# Patient Record
Sex: Female | Born: 1955
Health system: Southern US, Community
[De-identification: ages and names within clinical notes are randomized; demographics above are authoritative.]

## PROBLEM LIST (undated history)

## (undated) ENCOUNTER — Ambulatory Visit: Admission: EM | Payer: BC Managed Care – PPO | Source: Home / Self Care

## (undated) DIAGNOSIS — R5381 Other malaise: Secondary | ICD-10-CM

## (undated) DIAGNOSIS — E2839 Other primary ovarian failure: Secondary | ICD-10-CM

## (undated) DIAGNOSIS — E559 Vitamin D deficiency, unspecified: Secondary | ICD-10-CM

## (undated) DIAGNOSIS — R5383 Other fatigue: Secondary | ICD-10-CM

## (undated) DIAGNOSIS — J45909 Unspecified asthma, uncomplicated: Secondary | ICD-10-CM

## (undated) DIAGNOSIS — M797 Fibromyalgia: Secondary | ICD-10-CM

## (undated) HISTORY — DX: Other fatigue: R53.83

## (undated) HISTORY — DX: Other malaise: R53.81

## (undated) HISTORY — PX: BREAST CYST ASPIRATION: SHX578

## (undated) HISTORY — DX: Other primary ovarian failure: E28.39

## (undated) HISTORY — DX: Vitamin D deficiency, unspecified: E55.9

## (undated) HISTORY — PX: CYSTECTOMY: SHX5119

---
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
## 2008-02-20 ENCOUNTER — Emergency Department (HOSPITAL_COMMUNITY): Admission: EM | Admit: 2008-02-20 | Discharge: 2008-02-20 | Payer: Self-pay | Admitting: Emergency Medicine

## 2008-02-29 ENCOUNTER — Emergency Department (HOSPITAL_COMMUNITY): Admission: EM | Admit: 2008-02-29 | Discharge: 2008-02-29 | Payer: Self-pay | Admitting: Emergency Medicine

## 2010-01-04 ENCOUNTER — Emergency Department (HOSPITAL_COMMUNITY): Admission: EM | Admit: 2010-01-04 | Discharge: 2010-01-04 | Payer: Self-pay | Admitting: Emergency Medicine

## 2014-12-09 ENCOUNTER — Other Ambulatory Visit: Payer: Self-pay | Admitting: Obstetrics and Gynecology

## 2014-12-09 DIAGNOSIS — N6310 Unspecified lump in the right breast, unspecified quadrant: Secondary | ICD-10-CM

## 2014-12-16 ENCOUNTER — Ambulatory Visit
Admission: RE | Admit: 2014-12-16 | Discharge: 2014-12-16 | Disposition: A | Discharge status: Home / Self Care | Payer: 59 | Source: Ambulatory Visit | Attending: Obstetrics and Gynecology | Admitting: Obstetrics and Gynecology

## 2014-12-16 DIAGNOSIS — N6310 Unspecified lump in the right breast, unspecified quadrant: Secondary | ICD-10-CM

## 2015-08-09 ENCOUNTER — Other Ambulatory Visit (HOSPITAL_COMMUNITY): Payer: Self-pay | Admitting: Internal Medicine

## 2015-08-09 DIAGNOSIS — R519 Headache, unspecified: Secondary | ICD-10-CM

## 2015-08-09 DIAGNOSIS — R51 Headache: Principal | ICD-10-CM

## 2015-08-09 DIAGNOSIS — G8929 Other chronic pain: Secondary | ICD-10-CM

## 2015-08-29 ENCOUNTER — Ambulatory Visit (HOSPITAL_COMMUNITY): Payer: 59

## 2015-12-22 ENCOUNTER — Other Ambulatory Visit: Payer: Self-pay

## 2015-12-22 DIAGNOSIS — Z1231 Encounter for screening mammogram for malignant neoplasm of breast: Secondary | ICD-10-CM

## 2016-01-09 ENCOUNTER — Ambulatory Visit
Admission: RE | Admit: 2016-01-09 | Discharge: 2016-01-09 | Disposition: A | Discharge status: Home / Self Care | Payer: BLUE CROSS/BLUE SHIELD | Source: Ambulatory Visit

## 2016-01-09 DIAGNOSIS — Z1231 Encounter for screening mammogram for malignant neoplasm of breast: Secondary | ICD-10-CM

## 2016-01-15 ENCOUNTER — Emergency Department (HOSPITAL_COMMUNITY)
Admission: EM | Admit: 2016-01-15 | Discharge: 2016-01-15 | Disposition: A | Discharge status: Home / Self Care | Payer: BLUE CROSS/BLUE SHIELD | Attending: Emergency Medicine | Admitting: Emergency Medicine

## 2016-01-15 ENCOUNTER — Emergency Department (HOSPITAL_COMMUNITY): Payer: BLUE CROSS/BLUE SHIELD

## 2016-01-15 ENCOUNTER — Encounter (HOSPITAL_COMMUNITY): Payer: Self-pay | Admitting: Emergency Medicine

## 2016-01-15 DIAGNOSIS — R51 Headache: Secondary | ICD-10-CM | POA: Diagnosis not present

## 2016-01-15 DIAGNOSIS — R509 Fever, unspecified: Secondary | ICD-10-CM | POA: Diagnosis not present

## 2016-01-15 DIAGNOSIS — R519 Headache, unspecified: Secondary | ICD-10-CM

## 2016-01-15 DIAGNOSIS — R000 Tachycardia, unspecified: Secondary | ICD-10-CM | POA: Diagnosis not present

## 2016-01-15 DIAGNOSIS — R05 Cough: Secondary | ICD-10-CM | POA: Diagnosis not present

## 2016-01-15 DIAGNOSIS — J40 Bronchitis, not specified as acute or chronic: Secondary | ICD-10-CM | POA: Insufficient documentation

## 2016-01-15 HISTORY — DX: Unspecified asthma, uncomplicated: J45.909

## 2016-01-15 MED ORDER — BENZONATATE 100 MG PO CAPS
100.0000 mg | ORAL_CAPSULE | Freq: Once | ORAL | Status: AC
  Administered 2016-01-15: 100 mg via ORAL
  Filled 2016-01-15: qty 1

## 2016-01-15 MED ORDER — ALBUTEROL SULFATE (2.5 MG/3ML) 0.083% IN NEBU
5.0000 mg | INHALATION_SOLUTION | Freq: Once | RESPIRATORY_TRACT | Status: AC
  Administered 2016-01-15: 5 mg via RESPIRATORY_TRACT
  Filled 2016-01-15: qty 6

## 2016-01-15 MED ORDER — KETOROLAC TROMETHAMINE 60 MG/2ML IM SOLN
60.0000 mg | Freq: Once | INTRAMUSCULAR | Status: AC
  Administered 2016-01-15: 60 mg via INTRAMUSCULAR
  Filled 2016-01-15: qty 2

## 2016-01-15 MED ORDER — BENZONATATE 100 MG PO CAPS: 100.0000 mg | ORAL_CAPSULE | Freq: Three times a day (TID) | ORAL | Status: DC

## 2016-01-15 MED ORDER — SUMATRIPTAN SUCCINATE 25 MG PO TABS: 50.0000 mg | ORAL_TABLET | ORAL | Status: DC | PRN

## 2016-01-15 MED ORDER — METHYLPREDNISOLONE 4 MG PO TBPK: ORAL_TABLET | ORAL | Status: DC

## 2016-01-15 MED ORDER — IBUPROFEN 800 MG PO TABS: 800.0000 mg | ORAL_TABLET | Freq: Three times a day (TID) | ORAL | Status: DC

## 2016-01-15 NOTE — ED Provider Notes (Signed)
CSN: [id number]     Arrival date & time 01/15/16  1707 History   First MD Initiated Contact with Patient 01/15/16 1723     Chief Complaint  Patient presents with  . Cough     (Consider location/radiation/quality/duration/timing/severity/associated sxs/prior Treatment) HPI Comments: The pt has had cough for 7 days Sore throat and wheezing Has HA as well - sx are persistent - nothing makes better - tried allergy meds without relief Today with fever of 100.9 at home.  No n/v/d.  No sick contacts.  Patient is a 60 y.o. female presenting with cough. The history is provided by the patient.  Cough   Past Medical History  Diagnosis Date  . Asthma    Past Surgical History  Procedure Laterality Date  . Cystectomy     History reviewed. No pertinent family history. Social History  Substance Use Topics  . Smoking status: Never Smoker   . Smokeless tobacco: None  . Alcohol Use: Yes     Comment: occ   OB History    No data available     Review of Systems  Respiratory: Positive for cough.   All other systems reviewed and are negative.     Allergies  Codeine  Home Medications   Prior to Admission medications   Medication Sig Start Date End Date Taking? Authorizing Provider  benzonatate (TESSALON) 100 MG capsule Take 1 capsule (100 mg total) by mouth every 8 (eight) hours. 01/15/16   Eber HongBrian Hartleigh Edmonston, MD  ibuprofen (ADVIL,MOTRIN) 800 MG tablet Take 1 tablet (800 mg total) by mouth 3 (three) times daily. 01/15/16   Eber HongBrian Makari Portman, MD  methylPREDNISolone (MEDROL DOSEPAK) 4 MG TBPK tablet As 6 day taper 01/15/16   Eber HongBrian Staphanie Harbison, MD  SUMAtriptan (IMITREX) 25 MG tablet Take 2 tablets (50 mg total) by mouth every 2 (two) hours as needed for migraine (ongoing headache). Maximum daily dose 200mg  01/15/16   Eber HongBrian Alquan Morrish, MD   BP 118/72 mmHg  Pulse 108  Temp(Src) 99.6 F (37.6 C) (Oral)  Resp 16  Ht 5' (1.524 m)  Wt 130 lb (58.968 kg)  BMI 25.39 kg/m2  SpO2 99% Physical Exam   Constitutional: She appears well-developed and well-nourished. No distress.  HENT:  Head: Normocephalic and atraumatic.  Mouth/Throat: Oropharynx is clear and moist. No oropharyngeal exudate.  Eyes: Conjunctivae and EOM are normal. Pupils are equal, round, and reactive to light. Right eye exhibits no discharge. Left eye exhibits no discharge. No scleral icterus.  Neck: Normal range of motion. Neck supple. No JVD present. No thyromegaly present.  Cardiovascular: Regular rhythm, normal heart sounds and intact distal pulses.  Exam reveals no gallop and no friction rub.   No murmur heard. Mild tachycardia, strong pulses in both arms  Pulmonary/Chest: Effort normal. No respiratory distress. She has wheezes ( scant expiratory intermittent wheezing). She has no rales.  Abdominal: Soft. Bowel sounds are normal. She exhibits no distension and no mass. There is no tenderness.  Musculoskeletal: Normal range of motion. She exhibits no edema or tenderness.  Lymphadenopathy:    She has no cervical adenopathy.  Neurological: She is alert. Coordination normal.  Skin: Skin is warm and dry. No rash noted. No erythema.  Psychiatric: She has a normal mood and affect. Her behavior is normal.  Nursing note and vitals reviewed.   ED Course  Procedures (including critical care time) Labs Review Labs Reviewed - No data to display  Imaging Review Dg Chest 2 View  01/15/2016  CLINICAL DATA:  Cough  and fever 1 week. EXAM: CHEST  2 VIEW COMPARISON:  None. FINDINGS: Lungs are adequately inflated without consolidation or effusion. Cardiomediastinal silhouette is within normal. There mild degenerate changes of the spine. IMPRESSION: No active cardiopulmonary disease. Electronically Signed   By: Elberta Fortis M.D.   On: 01/15/2016 18:19   I have personally reviewed and evaluated these images and lab results as part of my medical decision-making.   MDM   Final diagnoses:  Bronchitis  Nonintractable headache,  unspecified chronicity pattern, unspecified headache type    Pt is well appearing - VS are normal other than mild tachy - has lungs with some wheezing, no rales, no distress, speak is full sentences, no distress  Tesaslon Toradol Albuterol CXR r/o pna Pt in agreement.  Cough better - ha with mild improvement CXR without acuet findings Pt improved - informed of results.  Meds given in ED:  Medications  benzonatate (TESSALON) capsule 100 mg (100 mg Oral Given 01/15/16 1806)  ketorolac (TORADOL) injection 60 mg (60 mg Intramuscular Given 01/15/16 1805)  albuterol (PROVENTIL) (2.5 MG/3ML) 0.083% nebulizer solution 5 mg (5 mg Nebulization Given 01/15/16 1829)    New Prescriptions   BENZONATATE (TESSALON) 100 MG CAPSULE    Take 1 capsule (100 mg total) by mouth every 8 (eight) hours.   IBUPROFEN (ADVIL,MOTRIN) 800 MG TABLET    Take 1 tablet (800 mg total) by mouth 3 (three) times daily.   METHYLPREDNISOLONE (MEDROL DOSEPAK) 4 MG TBPK TABLET    As 6 day taper   SUMATRIPTAN (IMITREX) 25 MG TABLET    Take 2 tablets (50 mg total) by mouth every 2 (two) hours as needed for migraine (ongoing headache). Maximum daily dose        Eber Hong, MD 01/15/16 Ernestina [place]

## 2016-01-15 NOTE — ED Notes (Signed)
Pt reports cough, seasonal allergies, and fever x1 week.  Pt took advil today at 1400. Pt also has h/a x1 week.  Pt alert and oriented.

## 2016-01-15 NOTE — ED Notes (Signed)
Pt alert & oriented x4, stable gait. Patient given discharge instructions, paperwork & prescription(s). Patient instructed to stop at the registration desk to finish any additional paperwork. Patient verbalized understanding. Pt left department in wheelchair escorted by staff. Pt left w/ no further questions. 

## 2016-01-15 NOTE — Discharge Instructions (Signed)
Albuterol 2 puffs every 4 hours for 24 hours, then as needed every 4 hours Medrol dose pack for 6 days Tessalon every 8 hours as needed Motrin 3 times daily for headache / fever Imitrex for headache  Please obtain all of your results from medical records or have your doctors office obtain the results - share them with your doctor - you should be seen at your doctors office in the next 2 days. Call today to arrange your follow up. Take the medications as prescribed. Please review all of the medicines and only take them if you do not have an allergy to them. Please be aware that if you are taking birth control pills, taking other prescriptions, ESPECIALLY ANTIBIOTICS may make the birth control ineffective - if this is the case, either do not engage in sexual activity or use alternative methods of birth control such as condoms until you have finished the medicine and your family doctor says it is OK to restart them. If you are on a blood thinner such as COUMADIN, be aware that any other medicine that you take may cause the coumadin to either work too much, or not enough - you should have your coumadin level rechecked in next 7 days if this is the case.  ?  It is also a possibility that you have an allergic reaction to any of the medicines that you have been prescribed - Everybody reacts differently to medications and while MOST people have no trouble with most medicines, you may have a reaction such as nausea, vomiting, rash, swelling, shortness of breath. If this is the case, please stop taking the medicine immediately and contact your physician.  ?  You should return to the ER if you develop severe or worsening symptoms.

## 2016-01-18 ENCOUNTER — Other Ambulatory Visit (HOSPITAL_COMMUNITY): Payer: Self-pay | Admitting: Internal Medicine

## 2016-01-18 DIAGNOSIS — G4482 Headache associated with sexual activity: Secondary | ICD-10-CM | POA: Diagnosis not present

## 2016-01-18 DIAGNOSIS — R519 Headache, unspecified: Secondary | ICD-10-CM

## 2016-01-18 DIAGNOSIS — R51 Headache: Principal | ICD-10-CM

## 2016-01-18 DIAGNOSIS — R739 Hyperglycemia, unspecified: Secondary | ICD-10-CM | POA: Diagnosis not present

## 2016-02-01 ENCOUNTER — Other Ambulatory Visit (HOSPITAL_COMMUNITY): Payer: BLUE CROSS/BLUE SHIELD

## 2016-02-15 ENCOUNTER — Ambulatory Visit (HOSPITAL_COMMUNITY)
Admission: RE | Admit: 2016-02-15 | Discharge: 2016-02-15 | Disposition: A | Discharge status: Home / Self Care | Payer: BLUE CROSS/BLUE SHIELD | Source: Ambulatory Visit | Attending: Internal Medicine | Admitting: Internal Medicine

## 2016-02-15 DIAGNOSIS — M720 Palmar fascial fibromatosis [Dupuytren]: Secondary | ICD-10-CM | POA: Insufficient documentation

## 2016-02-15 DIAGNOSIS — R519 Headache, unspecified: Secondary | ICD-10-CM

## 2016-02-15 DIAGNOSIS — R51 Headache: Secondary | ICD-10-CM | POA: Diagnosis not present

## 2016-05-16 DIAGNOSIS — M79671 Pain in right foot: Secondary | ICD-10-CM | POA: Diagnosis not present

## 2016-05-28 DIAGNOSIS — G5761 Lesion of plantar nerve, right lower limb: Secondary | ICD-10-CM | POA: Diagnosis not present

## 2016-05-28 DIAGNOSIS — M7661 Achilles tendinitis, right leg: Secondary | ICD-10-CM | POA: Diagnosis not present

## 2016-06-22 DIAGNOSIS — M79671 Pain in right foot: Secondary | ICD-10-CM | POA: Diagnosis not present

## 2016-07-26 DIAGNOSIS — G44009 Cluster headache syndrome, unspecified, not intractable: Secondary | ICD-10-CM | POA: Diagnosis not present

## 2016-08-03 DIAGNOSIS — M79671 Pain in right foot: Secondary | ICD-10-CM | POA: Diagnosis not present

## 2016-09-11 DIAGNOSIS — J019 Acute sinusitis, unspecified: Secondary | ICD-10-CM | POA: Diagnosis not present

## 2016-10-04 DIAGNOSIS — H903 Sensorineural hearing loss, bilateral: Secondary | ICD-10-CM | POA: Diagnosis not present

## 2016-10-11 DIAGNOSIS — Z0389 Encounter for observation for other suspected diseases and conditions ruled out: Secondary | ICD-10-CM | POA: Diagnosis not present

## 2016-10-11 DIAGNOSIS — R5383 Other fatigue: Secondary | ICD-10-CM | POA: Diagnosis not present

## 2016-10-11 DIAGNOSIS — Z0000 Encounter for general adult medical examination without abnormal findings: Secondary | ICD-10-CM | POA: Diagnosis not present

## 2016-10-11 DIAGNOSIS — Z23 Encounter for immunization: Secondary | ICD-10-CM | POA: Diagnosis not present

## 2016-10-11 DIAGNOSIS — E559 Vitamin D deficiency, unspecified: Secondary | ICD-10-CM | POA: Diagnosis not present

## 2016-10-11 DIAGNOSIS — R739 Hyperglycemia, unspecified: Secondary | ICD-10-CM | POA: Diagnosis not present

## 2016-10-11 DIAGNOSIS — E785 Hyperlipidemia, unspecified: Secondary | ICD-10-CM | POA: Diagnosis not present

## 2016-12-05 DIAGNOSIS — M545 Low back pain: Secondary | ICD-10-CM | POA: Diagnosis not present

## 2017-01-07 DIAGNOSIS — M25511 Pain in right shoulder: Secondary | ICD-10-CM | POA: Diagnosis not present

## 2017-01-15 DIAGNOSIS — R5383 Other fatigue: Secondary | ICD-10-CM | POA: Diagnosis not present

## 2017-01-15 DIAGNOSIS — E559 Vitamin D deficiency, unspecified: Secondary | ICD-10-CM | POA: Diagnosis not present

## 2017-01-25 DIAGNOSIS — Z1231 Encounter for screening mammogram for malignant neoplasm of breast: Secondary | ICD-10-CM | POA: Diagnosis not present

## 2017-02-01 DIAGNOSIS — M25511 Pain in right shoulder: Secondary | ICD-10-CM | POA: Diagnosis not present

## 2017-04-05 DIAGNOSIS — M25511 Pain in right shoulder: Secondary | ICD-10-CM | POA: Diagnosis not present

## 2017-05-09 DIAGNOSIS — T63481A Toxic effect of venom of other arthropod, accidental (unintentional), initial encounter: Secondary | ICD-10-CM | POA: Diagnosis not present

## 2017-05-21 ENCOUNTER — Other Ambulatory Visit (HOSPITAL_COMMUNITY): Payer: Self-pay | Admitting: Internal Medicine

## 2017-05-21 ENCOUNTER — Ambulatory Visit (HOSPITAL_COMMUNITY)
Admission: RE | Admit: 2017-05-21 | Discharge: 2017-05-21 | Disposition: A | Discharge status: Home / Self Care | Payer: BLUE CROSS/BLUE SHIELD | Source: Ambulatory Visit | Attending: Internal Medicine | Admitting: Internal Medicine

## 2017-05-21 DIAGNOSIS — R059 Cough, unspecified: Secondary | ICD-10-CM

## 2017-05-21 DIAGNOSIS — R918 Other nonspecific abnormal finding of lung field: Secondary | ICD-10-CM | POA: Diagnosis not present

## 2017-05-21 DIAGNOSIS — R05 Cough: Secondary | ICD-10-CM

## 2017-06-03 DIAGNOSIS — R05 Cough: Secondary | ICD-10-CM | POA: Diagnosis not present

## 2017-06-25 DIAGNOSIS — J019 Acute sinusitis, unspecified: Secondary | ICD-10-CM | POA: Diagnosis not present

## 2017-07-10 DIAGNOSIS — R739 Hyperglycemia, unspecified: Secondary | ICD-10-CM | POA: Diagnosis not present

## 2017-07-10 DIAGNOSIS — N390 Urinary tract infection, site not specified: Secondary | ICD-10-CM | POA: Diagnosis not present

## 2017-07-10 DIAGNOSIS — E559 Vitamin D deficiency, unspecified: Secondary | ICD-10-CM | POA: Diagnosis not present

## 2017-07-10 DIAGNOSIS — E2839 Other primary ovarian failure: Secondary | ICD-10-CM | POA: Diagnosis not present

## 2017-07-10 DIAGNOSIS — E785 Hyperlipidemia, unspecified: Secondary | ICD-10-CM | POA: Diagnosis not present

## 2017-07-10 DIAGNOSIS — R5383 Other fatigue: Secondary | ICD-10-CM | POA: Diagnosis not present

## 2017-08-28 DIAGNOSIS — E2839 Other primary ovarian failure: Secondary | ICD-10-CM | POA: Diagnosis not present

## 2017-08-28 DIAGNOSIS — R5383 Other fatigue: Secondary | ICD-10-CM | POA: Diagnosis not present

## 2017-09-02 DIAGNOSIS — Z6825 Body mass index (BMI) 25.0-25.9, adult: Secondary | ICD-10-CM | POA: Diagnosis not present

## 2017-09-02 DIAGNOSIS — Z01419 Encounter for gynecological examination (general) (routine) without abnormal findings: Secondary | ICD-10-CM | POA: Diagnosis not present

## 2017-09-02 DIAGNOSIS — Z13820 Encounter for screening for osteoporosis: Secondary | ICD-10-CM | POA: Diagnosis not present

## 2017-09-09 DIAGNOSIS — M25551 Pain in right hip: Secondary | ICD-10-CM | POA: Diagnosis not present

## 2017-09-16 DIAGNOSIS — R102 Pelvic and perineal pain: Secondary | ICD-10-CM | POA: Diagnosis not present

## 2017-09-18 DIAGNOSIS — M545 Low back pain: Secondary | ICD-10-CM | POA: Diagnosis not present

## 2017-09-19 DIAGNOSIS — R87618 Other abnormal cytological findings on specimens from cervix uteri: Secondary | ICD-10-CM | POA: Diagnosis not present

## 2017-09-19 DIAGNOSIS — R87810 Cervical high risk human papillomavirus (HPV) DNA test positive: Secondary | ICD-10-CM | POA: Diagnosis not present

## 2017-09-19 DIAGNOSIS — A630 Anogenital (venereal) warts: Secondary | ICD-10-CM | POA: Diagnosis not present

## 2017-10-01 DIAGNOSIS — Z780 Asymptomatic menopausal state: Secondary | ICD-10-CM | POA: Diagnosis not present

## 2017-10-01 DIAGNOSIS — E2839 Other primary ovarian failure: Secondary | ICD-10-CM | POA: Diagnosis not present

## 2017-10-15 HISTORY — PX: BREAST BIOPSY: SHX20

## 2017-11-05 DIAGNOSIS — R5383 Other fatigue: Secondary | ICD-10-CM | POA: Diagnosis not present

## 2017-11-05 DIAGNOSIS — E2839 Other primary ovarian failure: Secondary | ICD-10-CM | POA: Diagnosis not present

## 2017-11-05 DIAGNOSIS — E785 Hyperlipidemia, unspecified: Secondary | ICD-10-CM | POA: Diagnosis not present

## 2017-11-05 DIAGNOSIS — Z780 Asymptomatic menopausal state: Secondary | ICD-10-CM | POA: Diagnosis not present

## 2017-12-10 DIAGNOSIS — E2839 Other primary ovarian failure: Secondary | ICD-10-CM | POA: Diagnosis not present

## 2017-12-10 DIAGNOSIS — Z780 Asymptomatic menopausal state: Secondary | ICD-10-CM | POA: Diagnosis not present

## 2017-12-10 DIAGNOSIS — R5383 Other fatigue: Secondary | ICD-10-CM | POA: Diagnosis not present

## 2017-12-13 DIAGNOSIS — R69 Illness, unspecified: Secondary | ICD-10-CM | POA: Diagnosis not present

## 2017-12-13 DIAGNOSIS — R509 Fever, unspecified: Secondary | ICD-10-CM | POA: Diagnosis not present

## 2017-12-13 DIAGNOSIS — G44201 Tension-type headache, unspecified, intractable: Secondary | ICD-10-CM | POA: Diagnosis not present

## 2017-12-13 DIAGNOSIS — R110 Nausea: Secondary | ICD-10-CM | POA: Diagnosis not present

## 2018-01-21 DIAGNOSIS — J019 Acute sinusitis, unspecified: Secondary | ICD-10-CM | POA: Diagnosis not present

## 2018-01-21 DIAGNOSIS — R6882 Decreased libido: Secondary | ICD-10-CM | POA: Diagnosis not present

## 2018-02-18 ENCOUNTER — Other Ambulatory Visit: Payer: Self-pay | Admitting: Obstetrics and Gynecology

## 2018-02-18 DIAGNOSIS — N644 Mastodynia: Secondary | ICD-10-CM

## 2018-02-18 DIAGNOSIS — N6459 Other signs and symptoms in breast: Secondary | ICD-10-CM | POA: Diagnosis not present

## 2018-02-19 ENCOUNTER — Ambulatory Visit
Admission: RE | Admit: 2018-02-19 | Discharge: 2018-02-19 | Disposition: A | Discharge status: Home / Self Care | Payer: BLUE CROSS/BLUE SHIELD | Source: Ambulatory Visit | Attending: Obstetrics and Gynecology | Admitting: Obstetrics and Gynecology

## 2018-02-19 ENCOUNTER — Other Ambulatory Visit: Payer: Self-pay | Admitting: Obstetrics and Gynecology

## 2018-02-19 DIAGNOSIS — R922 Inconclusive mammogram: Secondary | ICD-10-CM | POA: Diagnosis not present

## 2018-02-19 DIAGNOSIS — N6320 Unspecified lump in the left breast, unspecified quadrant: Secondary | ICD-10-CM | POA: Diagnosis not present

## 2018-02-19 DIAGNOSIS — N644 Mastodynia: Secondary | ICD-10-CM

## 2018-02-24 ENCOUNTER — Ambulatory Visit
Admission: RE | Admit: 2018-02-24 | Discharge: 2018-02-24 | Disposition: A | Discharge status: Home / Self Care | Payer: BLUE CROSS/BLUE SHIELD | Source: Ambulatory Visit | Attending: Obstetrics and Gynecology | Admitting: Obstetrics and Gynecology

## 2018-02-24 ENCOUNTER — Other Ambulatory Visit: Payer: Self-pay | Admitting: Obstetrics and Gynecology

## 2018-02-24 DIAGNOSIS — N6320 Unspecified lump in the left breast, unspecified quadrant: Secondary | ICD-10-CM

## 2018-02-24 DIAGNOSIS — N6032 Fibrosclerosis of left breast: Secondary | ICD-10-CM | POA: Diagnosis not present

## 2018-02-24 DIAGNOSIS — N6012 Diffuse cystic mastopathy of left breast: Secondary | ICD-10-CM | POA: Diagnosis not present

## 2018-02-24 DIAGNOSIS — R928 Other abnormal and inconclusive findings on diagnostic imaging of breast: Secondary | ICD-10-CM | POA: Diagnosis not present

## 2018-02-28 ENCOUNTER — Other Ambulatory Visit: Payer: Self-pay | Admitting: Obstetrics and Gynecology

## 2018-02-28 DIAGNOSIS — N6099 Unspecified benign mammary dysplasia of unspecified breast: Secondary | ICD-10-CM

## 2018-03-03 DIAGNOSIS — R05 Cough: Secondary | ICD-10-CM | POA: Diagnosis not present

## 2018-03-03 DIAGNOSIS — R5383 Other fatigue: Secondary | ICD-10-CM | POA: Diagnosis not present

## 2018-03-03 DIAGNOSIS — R6882 Decreased libido: Secondary | ICD-10-CM | POA: Diagnosis not present

## 2018-03-03 DIAGNOSIS — Z780 Asymptomatic menopausal state: Secondary | ICD-10-CM | POA: Diagnosis not present

## 2018-03-03 DIAGNOSIS — E2839 Other primary ovarian failure: Secondary | ICD-10-CM | POA: Diagnosis not present

## 2018-03-06 ENCOUNTER — Ambulatory Visit
Admission: RE | Admit: 2018-03-06 | Discharge: 2018-03-06 | Disposition: A | Discharge status: Home / Self Care | Payer: BLUE CROSS/BLUE SHIELD | Source: Ambulatory Visit | Attending: Obstetrics and Gynecology | Admitting: Obstetrics and Gynecology

## 2018-03-06 DIAGNOSIS — N6099 Unspecified benign mammary dysplasia of unspecified breast: Secondary | ICD-10-CM

## 2018-03-06 DIAGNOSIS — N644 Mastodynia: Secondary | ICD-10-CM | POA: Diagnosis not present

## 2018-03-06 MED ORDER — GADOBENATE DIMEGLUMINE 529 MG/ML IV SOLN
12.0000 mL | Freq: Once | INTRAVENOUS | Status: AC | PRN
  Administered 2018-03-06: 12 mL via INTRAVENOUS

## 2018-06-04 DIAGNOSIS — R6882 Decreased libido: Secondary | ICD-10-CM | POA: Diagnosis not present

## 2018-06-04 DIAGNOSIS — J019 Acute sinusitis, unspecified: Secondary | ICD-10-CM | POA: Diagnosis not present

## 2018-06-04 DIAGNOSIS — R5383 Other fatigue: Secondary | ICD-10-CM | POA: Diagnosis not present

## 2018-06-04 DIAGNOSIS — G4482 Headache associated with sexual activity: Secondary | ICD-10-CM | POA: Diagnosis not present

## 2018-07-09 DIAGNOSIS — J019 Acute sinusitis, unspecified: Secondary | ICD-10-CM | POA: Diagnosis not present

## 2018-07-09 DIAGNOSIS — J209 Acute bronchitis, unspecified: Secondary | ICD-10-CM | POA: Diagnosis not present

## 2018-07-20 DIAGNOSIS — J40 Bronchitis, not specified as acute or chronic: Secondary | ICD-10-CM | POA: Diagnosis not present

## 2018-07-20 DIAGNOSIS — Z6824 Body mass index (BMI) 24.0-24.9, adult: Secondary | ICD-10-CM | POA: Diagnosis not present

## 2018-07-29 DIAGNOSIS — H1045 Other chronic allergic conjunctivitis: Secondary | ICD-10-CM | POA: Diagnosis not present

## 2018-08-06 DIAGNOSIS — S300XXA Contusion of lower back and pelvis, initial encounter: Secondary | ICD-10-CM | POA: Diagnosis not present

## 2018-08-21 DIAGNOSIS — R5383 Other fatigue: Secondary | ICD-10-CM | POA: Diagnosis not present

## 2018-08-21 DIAGNOSIS — R6882 Decreased libido: Secondary | ICD-10-CM | POA: Diagnosis not present

## 2018-08-21 DIAGNOSIS — Z23 Encounter for immunization: Secondary | ICD-10-CM | POA: Diagnosis not present

## 2018-08-21 DIAGNOSIS — E2839 Other primary ovarian failure: Secondary | ICD-10-CM | POA: Diagnosis not present

## 2018-08-21 DIAGNOSIS — M797 Fibromyalgia: Secondary | ICD-10-CM | POA: Diagnosis not present

## 2018-08-21 DIAGNOSIS — E559 Vitamin D deficiency, unspecified: Secondary | ICD-10-CM | POA: Diagnosis not present

## 2018-11-24 ENCOUNTER — Ambulatory Visit (HOSPITAL_COMMUNITY)
Admission: RE | Admit: 2018-11-24 | Discharge: 2018-11-24 | Disposition: A | Discharge status: Home / Self Care | Payer: BLUE CROSS/BLUE SHIELD | Source: Ambulatory Visit | Attending: Internal Medicine | Admitting: Internal Medicine

## 2018-11-24 ENCOUNTER — Other Ambulatory Visit (HOSPITAL_COMMUNITY): Payer: Self-pay | Admitting: Internal Medicine

## 2018-11-24 DIAGNOSIS — R0602 Shortness of breath: Secondary | ICD-10-CM | POA: Diagnosis not present

## 2018-11-24 DIAGNOSIS — R059 Cough, unspecified: Secondary | ICD-10-CM

## 2018-11-24 DIAGNOSIS — R5383 Other fatigue: Secondary | ICD-10-CM | POA: Diagnosis not present

## 2018-11-24 DIAGNOSIS — E2839 Other primary ovarian failure: Secondary | ICD-10-CM | POA: Diagnosis not present

## 2018-11-24 DIAGNOSIS — Z780 Asymptomatic menopausal state: Secondary | ICD-10-CM | POA: Diagnosis not present

## 2018-11-24 DIAGNOSIS — J209 Acute bronchitis, unspecified: Secondary | ICD-10-CM | POA: Diagnosis not present

## 2018-11-24 DIAGNOSIS — R05 Cough: Secondary | ICD-10-CM | POA: Diagnosis not present

## 2018-12-19 ENCOUNTER — Encounter: Payer: Self-pay | Admitting: Allergy & Immunology

## 2018-12-19 ENCOUNTER — Ambulatory Visit (INDEPENDENT_AMBULATORY_CARE_PROVIDER_SITE_OTHER): Payer: BLUE CROSS/BLUE SHIELD | Admitting: Allergy & Immunology

## 2018-12-19 VITALS — BP 116/64 | HR 86 | Temp 98.0°F | Resp 17 | Ht 60.63 in | Wt 125.0 lb

## 2018-12-19 DIAGNOSIS — J302 Other seasonal allergic rhinitis: Secondary | ICD-10-CM | POA: Diagnosis not present

## 2018-12-19 DIAGNOSIS — J3089 Other allergic rhinitis: Secondary | ICD-10-CM

## 2018-12-19 DIAGNOSIS — J4540 Moderate persistent asthma, uncomplicated: Secondary | ICD-10-CM | POA: Insufficient documentation

## 2018-12-19 MED ORDER — FLUTICASONE FUROATE-VILANTEROL 100-25 MCG/INH IN AEPB: 1.0000 | INHALATION_SPRAY | Freq: Every day | RESPIRATORY_TRACT | 5 refills | Status: DC

## 2018-12-19 MED ORDER — TRIAMCINOLONE ACETONIDE 55 MCG/ACT NA AERO: 1.0000 | INHALATION_SPRAY | Freq: Every day | NASAL | 5 refills | Status: DC

## 2018-12-19 MED ORDER — EPINEPHRINE 0.3 MG/0.3ML IJ SOAJ: 0.3000 mg | INTRAMUSCULAR | 2 refills | Status: DC | PRN

## 2018-12-19 MED ORDER — METHYLPREDNISOLONE ACETATE 40 MG/ML IJ SUSP
40.0000 mg | Freq: Once | INTRAMUSCULAR | Status: AC
  Administered 2018-12-19: 40 mg via INTRAMUSCULAR

## 2018-12-19 NOTE — Addendum Note (Signed)
Addended by: Shona Simpson A on: 12/19/2018 12:17 PM   Modules accepted: Orders

## 2018-12-19 NOTE — Progress Notes (Signed)
oi

## 2018-12-19 NOTE — Progress Notes (Signed)
NEW PATIENT  Date of Service/Encounter:  12/19/18  Referring provider: Doree Albee, MD   Assessment:   Moderate persistent asthma, uncomplicated   Seasonal and perennial allergic rhinitis (ragweed, weeds, grasses, indoor molds, dust mites, cat and dog)  Plan/Recommendations:   1. Moderate persistent asthma, uncomplicated - Lung testing looked good today, but I think you would benefit from a daily controller inhaler. - We will start Breo one puff once daily (this contains a long acting albuterol combined with a long acting albuterol). - Hopefully this will decrease the need for the albuterol rescue inhaler. - Daily controller medication(s): Breo 100/52mg one puff once daily - Prior to physical activity: albuterol 2 puffs 10-15 minutes before physical activity. - Rescue medications: albuterol 4 puffs every 4-6 hours as needed - Asthma control goals:  * Full participation in all desired activities (may need albuterol before activity) * Albuterol use two time or less a week on average (not counting use with activity) * Cough interfering with sleep two time or less a month * Oral steroids no more than once a year * No hospitalizations  2. Chronic rhinitis - Testing today showed: ragweed, weeds, grasses, indoor molds, dust mites, cat and dog - Copy of test results provided.  - Avoidance measures provided. - Continue with: Allegra (fexofenadine) 187mtable once daily - Start taking: Nasacort (triamcinolone) one spray per nostril daily - You can use an extra dose of the antihistamine, if needed, for breakthrough symptoms.  - Consider nasal saline rinses 1-2 times daily to remove allergens from the nasal cavities as well as help with mucous clearance (this is especially helpful to do before the nasal sprays are given) - Make an appointment in two weeks for your first allergy shot. - Allergy shot Consent signed today.   3. Return in about 3 months (around  03/21/2019).   Subjective:   Sabrina Clark a [age].o. female presenting today for evaluation of  Chief Complaint  Patient presents with  . Allergic Rhinitis     Sabrina CARDIFFas a history of the following: Patient Active Problem List   Diagnosis Date Noted  . Seasonal and perennial allergic rhinitis 12/19/2018  . Moderate persistent asthma, uncomplicated [id number]  History obtained from: chart review and patient and her husband.  I have actually met Sabrina Clark the past, as her mother is 1 of our patients.  Sabrina Clark referred by GoDoree AlbeeMD.     MaTamerias a [age].o. female presenting for an evaluation of Chronic rhinitis and bronchitis.   Asthma/Respiratory Symptom History: She has never been officially diagnosed with asthma, but she has had an inhaler for quite some time.  Since her [age] has become ill around twice per year.  Typically it is in the spring and the fall.  During each of these episodes, she is given prednisone occasionally with an intramuscular steroid injection as well.  Over the last couple years, her frequency of bronchitis have increased.  Last year for instance she had a total of 5 courses of systemic steroids for her symptoms.  During this time, she is only ever had an albuterol inhaler.  Prior to 2 years ago her inhaler would expire before she used it up, but over the last 2 years her use of albuterol has increased at least 50%.  She has never been in the hospital for her symptoms and is never been intubated.  She does endorse some  nighttime coughing, although this is typically when she has the bronchitis episodes.  She was prescribed Singulair last year, but after reading the side effects she never took it. She had similar problems when she was a youngster, but again she never had a controller medication or a diagnosis of asthma.   Allergic Rhinitis Symptom History: She does report itchy watery eyes as well as chronic rhinitis.  This is  typically worse in the spring and fall, with winter and summer being her better months.  However over the last 2 years, she is even had symptoms during the winter and summer.  She is on Allegra consistently throughout the year.  She is able to feel it when she does not use it.  She has had Flonase at one point, but is hesitant to use these nasal sprays since she has heard that they decrease her sense of smell.  Regardless, she is reporting a loss of sense of smell anyway without the use of the sprays.  She knows that cats are definitely a trigger for her, although when she adopted her cats over the last few years she has had no increased symptoms.  She has never been on a nose spray.  She has never been tested.  Otherwise, there is no history of other atopic diseases, including food allergies, drug allergies, stinging insect allergies, eczema, urticaria or contact dermatitis. There is no significant infectious history. Vaccinations are up to date. She has received a Pneumovax vaccination.   Past Medical History: Patient Active Problem List   Diagnosis Date Noted  . Seasonal and perennial allergic rhinitis 12/19/2018  . Moderate persistent asthma, uncomplicated 76/28/3151    Medication List:  Allergies as of 12/19/2018      Reactions   Codeine Hives   Itch    Cefdinir    Diphenhydramine Hcl Hives      Medication List       Accurate as of December 19, 2018 12:00 PM. Always use your most recent med list.        butalbital-acetaminophen-caffeine 50-325-40 MG tablet Commonly known as:  FIORICET, ESGIC   clonazePAM 0.5 MG tablet Commonly known as:  KLONOPIN   EPINEPHrine 0.3 mg/0.3 mL Soaj injection Commonly known as:  Auvi-Q Inject 0.3 mLs (0.3 mg total) into the muscle as needed for anaphylaxis.   estradiol 1 MG tablet Commonly known as:  ESTRACE   fluticasone furoate-vilanterol 100-25 MCG/INH Aepb Commonly known as:  Breo Ellipta Inhale 1 puff into the lungs daily.   guaiFENesin  600 MG 12 hr tablet Commonly known as:  MUCINEX Take 1,200 mg by mouth 2 (two) times daily.   ibuprofen 800 MG tablet Commonly known as:  ADVIL,MOTRIN Take 1 tablet (800 mg total) by mouth 3 (three) times daily.   NP Thyroid 60 MG tablet Generic drug:  thyroid   ProAir HFA 108 (90 Base) MCG/ACT inhaler Generic drug:  albuterol   progesterone 100 MG capsule Commonly known as:  PROMETRIUM   triamcinolone 55 MCG/ACT Aero nasal inhaler Commonly known as:  NASACORT Place 1 spray into the nose daily.   Vitamin D3 250 MCG (10000 UT) Tabs Take 1,000 Units by mouth.       Birth History: non-contributory  Developmental History: non-contributory.   Past Surgical History: Past Surgical History:  Procedure Laterality Date  . CYSTECTOMY       Family History: Family History  Problem Relation Age of Onset  . Allergic rhinitis Father      Social History:  Yulieth lives at home with her husband of 10 years.  They live in a house that was built in 1995.  There is wood in the main living areas in the bedrooms.  They have electric heating and central cooling.  They have 2 small dogs and 2 cats in the home.  They actually live on a "h[place] farm".  They have a total of 3 large dogs, 2 cats, 7 cows, 3 llamas, 2 alpacas, 1 goat, 1 meal, 3 geese, 2 turkeys, and 12 chickens.  They do not sell any of their eggs, but instead use them.  They will sell the baby cows.  They do not milk the cows.  Her husband has a much larger cattle farm operation as well.  In total, they have 25 acres where they live and over 150 acres from their cattle farm.  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   Review of Systems  Constitutional: Negative.  Negative for fever, malaise/fatigue and weight loss.  HENT: Positive for congestion and sore throat. Negative for ear discharge and ear pain.        Positive for throat  clearing.  Eyes: Negative for pain, discharge and redness.  Respiratory: Positive for cough. Negative for sputum production, shortness of breath, wheezing and stridor.   Cardiovascular: Negative.  Negative for chest pain and palpitations.  Gastrointestinal: Negative for abdominal pain and heartburn.  Skin: Negative.  Negative for itching and rash.  Neurological: Negative for dizziness and headaches.  Endo/Heme/Allergies: Positive for environmental allergies. Does not bruise/bleed easily.       Objective:   Blood pressure 116/64, pulse 86, temperature 98 F (36.7 C), temperature source Oral, resp. rate 17, height 5' 0.63" (1.54 m), weight 125 lb (56.7 kg), SpO2 96 %. Body mass index is 23.91 kg/m.   Physical Exam:   Physical Exam  Constitutional: She appears well-developed.  Very pleasant female.  Talkative.  HENT:  Head: Normocephalic and atraumatic.  Right Ear: Tympanic membrane, external ear and ear canal normal. No drainage, swelling or tenderness. Tympanic membrane is not injected, not scarred, not erythematous, not retracted and not bulging.  Left Ear: Tympanic membrane, external ear and ear canal normal. No drainage, swelling or tenderness. Tympanic membrane is not injected, not scarred, not erythematous, not retracted and not bulging.  Nose: Mucosal edema and rhinorrhea present. No nasal deformity or septal deviation. No epistaxis. Right sinus exhibits no maxillary sinus tenderness and no frontal sinus tenderness. Left sinus exhibits no maxillary sinus tenderness and no frontal sinus tenderness.  Mouth/Throat: Uvula is midline and oropharynx is clear and moist. Mucous membranes are not pale and not dry.  No cerumen present.  TMs appear pearly white.  She does have some cobblestoning present in the posterior oropharynx.  Eyes: Pupils are equal, round, and reactive to light. Conjunctivae and EOM are normal. Right eye exhibits no chemosis and no discharge. Left eye exhibits no  chemosis and no discharge. Right conjunctiva is not injected. Left conjunctiva is not injected.  Cardiovascular: Normal rate, regular rhythm and normal heart sounds.  Respiratory: Effort normal and breath sounds normal. No accessory muscle usage. No tachypnea. No respiratory distress. She has no wheezes. She has no rhonchi. She has no rales. She exhibits no tenderness.  Moving air well in all lung fields.  Intermittent dry cough present.  GI: There is no abdominal tenderness. There is no rebound  and no guarding.  Lymphadenopathy:       Head (right side): No submandibular, no tonsillar and no occipital adenopathy present.       Head (left side): No submandibular, no tonsillar and no occipital adenopathy present.    She has cervical adenopathy.  Bilateral shotty cervical lymphadenopathy.  Neurological: She is alert.  Skin: No abrasion, no petechiae and no rash noted. Rash is not papular, not vesicular and not urticarial. No erythema. No pallor.  No eczematous lesions noted.  Psychiatric: She has a normal mood and affect.     Diagnostic studies:    Spirometry: results abnormal (FEV1: 1.58/85%, FVC: 2.59/101%, FEV1/FVC: 61%).    Spirometry consistent with mild obstructive disease.  Allergy Studies:    Airborne Adult Perc - 12/19/18 1000    Time Antigen Placed  1000    Allergen Manufacturer  Greer    Location  Back    Number of Test  60    Panel 1  Select    1. Control-Buffer 50% Glycerol  Negative    2. Control-Histamine 1 mg/ml  2+    3. Albumin saline  Negative    4. Show Low  2+    5. [place]  Negative    6. Johnson  Negative    7. [place] Blue  2+    8. Meadow Fescue  3+    9. Perennial Rye  3+    10. Sweet Vernal  3+    11. Timothy  4+    12. Cocklebur  Negative    13. Burweed Marshelder  Negative    14. Ragweed, short  2+    15. Ragweed, Giant  Negative    16. Plantain,  English  Negative    17. Lamb's Quarters  Negative    18. Sheep Sorrell  Negative    19. Rough  Pigweed  Negative    20. Marsh Elder, Rough  Negative    21. Mugwort, Common  Negative    22. Ash mix  Negative    23. Birch mix  Negative    24. Beech American  Negative    25. Box, Elder  Negative    26. Cedar, red  Negative    27. Cottonwood, [place]  Negative    28. Elm mix  Negative    29. Hickory mix  Negative    30. Maple mix  Negative    31. Oak, [place] mix  Negative    32. Pecan Pollen  Negative    33. Pine mix  Negative    34. Sycamore Eastern  Negative    35. [place], Black Pollen  Negative    36. Alternaria alternata  Negative    37. Cladosporium Herbarum  Negative    38. Aspergillus mix  Negative    39. Penicillium mix  Negative    40. Bipolaris sorokiniana (Helminthosporium)  Negative    41. Drechslera spicifera (Curvularia)  Negative    42. Mucor plumbeus  Negative    43. Fusarium moniliforme  Negative    44. Aureobasidium pullulans (pullulara)  Negative    45. Rhizopus oryzae  Negative    46. Botrytis cinera  Negative    47. Epicoccum nigrum  Negative    48. Phoma betae  Negative    49. Candida Albicans  Negative    50. Trichophyton mentagrophytes  Negative    51. Mite, D Farinae  5,000 AU/ml  4+    52. Mite, D Pteronyssinus  5,000 AU/ml  4+  53. Cat Hair 10,000 BAU/ml  2+    54.  Dog Epithelia  Negative    55. Mixed Feathers  Negative    56. Horse Epithelia  Negative    57. Cockroach, German  Negative    58. Mouse  Negative    59. Tobacco Leaf  Negative    Comments  Cattle IgE: NEGATIVE     Intradermal - 12/19/18 1106    Allergen Manufacturer  Lavella Hammock    Location  Arm    Number of Test  11   15   Intradermal  Select    Control  Negative    Guatem[place]  Negative    Johnson  1+    7 Grass  Omitted    Ragweed mix  Omitted    Weed mix  1+    Tree mix  Negative    Mold 1  Negative    Mold 2  Negative    Mold 3  Negative    Mold 4  2+    Cat  Omitted    Dog  3+    Cockroach  Negative    Mite mix  Omitted       Allergy testing results were read  and interpreted by myself, documented by clinical staff.         Salvatore Marvel, MD [facility]

## 2018-12-19 NOTE — Patient Instructions (Addendum)
1. Moderate persistent asthma, uncomplicated - Lung testing looked good today, but I think you would benefit from a daily controller inhaler. - We will start Breo one puff once daily (this contains a long acting albuterol combined with a long acting albuterol). - Hopefully this will decrease the need for the albuterol rescue inhaler. - Daily controller medication(s): Breo 100/7025mcg one puff once daily - Prior to physical activity: albuterol 2 puffs 10-15 minutes before physical activity. - Rescue medications: albuterol 4 puffs every 4-6 hours as needed - Asthma control goals:  * Full participation in all desired activities (may need albuterol before activity) * Albuterol use two time or less a week on average (not counting use with activity) * Cough interfering with sleep two time or less a month * Oral steroids no more than once a year * No hospitalizations  2. Chronic rhinitis - Testing today showed: ragweed, weeds, grasses, indoor molds, dust mites, cat and dog - Copy of test results provided.  - Avoidance measures provided. - Continue with: Allegra (fexofenadine) 180mg  table once daily - Start taking: Nasacort (triamcinolone) one spray per nostril daily - You can use an extra dose of the antihistamine, if needed, for breakthrough symptoms.  - Consider nasal saline rinses 1-2 times daily to remove allergens from the nasal cavities as well as help with mucous clearance (this is especially helpful to do before the nasal sprays are given) - Make an appointment in two weeks for your first allergy shot. - Allergy shot Consent signed today.   3. Return in about 3 months (around 03/21/2019).   Please inform us of any Emergency Department visits, hospitalizations, or changes in symptoms. Call us before going to the ED for breathing or allergy symptoms since we might be able to fit you in for a sick visit. Feel free to contact us anytime with any questions, problems, or concerns.  It was a  pleasure to see you and your family again today!  Websites that have reliable patient information: 1. American Academy of Asthma, Allergy, and Immunology: www.aaaai.org 2. Food Allergy Research and Education (FARE): foodallergy.org 3. Mothers of Asthmatics: http://www.asthmacommunitynetwork.org 4. American College of Allergy, Asthma, and Immunology: MissingWeapons.cawww.acaai.org   Make sure you are registered to vote! If you have moved or changed any of your contact information, you will need to get this updated before voting!    Voter ID laws are NOT going into effect for the General Election in November 2020! DO NOT let this stop you from exercising your right to vote!   Reducing Pollen Exposure  The American Academy of Allergy, Asthma and Immunology suggests the following steps to reduce your exposure to pollen during allergy seasons.    1. Do not hang sheets or clothing out to dry; pollen may collect on these items. 2. Do not mow lawns or spend time around freshly cut grass; mowing stirs up pollen. 3. Keep windows closed at night.  Keep car windows closed while driving. 4. Minimize morning activities outdoors, a time when pollen counts are usually at their highest. 5. Stay indoors as much as possible when pollen counts or humidity is high and on windy days when pollen tends to remain in the air longer. 6. Use air conditioning when possible.  Many air conditioners have filters that trap the pollen spores. 7. Use a HEPA room air filter to remove pollen form the indoor air you breathe.  Control of Mold Allergen   Mold and fungi can grow on a variety of  surfaces provided certain temperature and moisture conditions exist.  Outdoor molds grow on plants, decaying vegetation and soil.  The major outdoor mold, Alternaria and Cladosporium, are found in very high numbers during hot and dry conditions.  Generally, a late Summer - Fall peak is seen for common outdoor fungal spores.  Rain will temporarily lower  outdoor mold spore count, but counts rise rapidly when the rainy period ends.  The most important indoor molds are Aspergillus and Penicillium.  Dark, humid and poorly ventilated basements are ideal sites for mold growth.  The next most common sites of mold growth are the bathroom and the kitchen.   Indoor (Perennial) Mold Control   Positive indoor molds via skin testing: Fusarium, Aureobasidium (Pullulara) and Rhizopus (minor molds - not likely the largest contributor of your symptoms)  1. Maintain humidity below 50%. 2. Clean washable surfaces with 5% bleach solution. 3. Remove sources e.g. contaminated carpets.     Control of Dog or Cat Allergen  Avoidance is the best way to manage a dog or cat allergy. If you have a dog or cat and are allergic to dog or cats, consider removing the dog or cat from the home. If you have a dog or cat but don't want to find it a new home, or if your family wants a pet even though someone in the household is allergic, here are some strategies that may help keep symptoms at bay:  1. Keep the pet out of your bedroom and restrict it to only a few rooms. Be advised that keeping the dog or cat in only one room will not limit the allergens to that room. 2. Don't pet, hug or kiss the dog or cat; if you do, wash your hands with soap and water. 3. High-efficiency particulate air (HEPA) cleaners run continuously in a bedroom or living room can reduce allergen levels over time. 4. Regular use of a high-efficiency vacuum cleaner or a central vacuum can reduce allergen levels. 5. Giving your dog or cat a bath at least once a week can reduce airborne allergen.   Control of House Dust Mite Allergen    House dust mites play a major role in allergic asthma and rhinitis.  They occur in environments with high humidity wherever human skin, the food for dust mites is found. High levels have been detected in dust obtained from mattresses, pillows, carpets, upholstered  furniture, bed covers, clothes and soft toys.  The principal allergen of the house dust mite is found in its feces.  A gram of dust may contain 1,000 mites and 250,000 fecal particles.  Mite antigen is easily measured in the air during house cleaning activities.    1. Encase mattresses, including the box spring, and pillow, in an air tight cover.  Seal the zipper end of the encased mattresses with wide adhesive tape. 2. Wash the bedding in water of 130 degrees Farenheit weekly.  Avoid cotton comforters/quilts and flannel bedding: the most ideal bed covering is the dacron comforter. 3. Remove all upholstered furniture from the bedroom. 4. Remove carpets, carpet padding, rugs, and non-washable window drapes from the bedroom.  Wash drapes weekly or use plastic window coverings. 5. Remove all non-washable stuffed toys from the bedroom.  Wash stuffed toys weekly. 6. Have the room cleaned frequently with a vacuum cleaner and a damp dust-mop.  The patient should not be in a room which is being cleaned and should wait 1 hour after cleaning before going into the room. 7.  Close and seal all heating outlets in the bedroom.  Otherwise, the room will become filled with dust-laden air.  An electric heater can be used to heat the room. 8. Reduce indoor humidity to less than 50%.  Do not use a humidifier.  Allergy Shots   Allergies are the result of a chain reaction that starts in the immune system. Your immune system controls how your body defends itself. For instance, if you have an allergy to pollen, your immune system identifies pollen as an invader or allergen. Your immune system overreacts by producing antibodies called Immunoglobulin E (IgE). These antibodies travel to cells that release chemicals, causing an allergic reaction.  The concept behind allergy immunotherapy, whether it is received in the form of shots or tablets, is that the immune system can be desensitized to specific allergens that trigger  allergy symptoms. Although it requires time and patience, the payback can be long-term relief.  How Do Allergy Shots Work?  Allergy shots work much like a vaccine. Your body responds to injected amounts of a particular allergen given in increasing doses, eventually developing a resistance and tolerance to it. Allergy shots can lead to decreased, minimal or no allergy symptoms.  There generally are two phases: build-up and maintenance. Build-up often ranges from three to six months and involves receiving injections with increasing amounts of the allergens. The shots are typically given once or twice a week, though more rapid build-up schedules are sometimes used.  The maintenance phase begins when the most effective dose is reached. This dose is different for each person, depending on how allergic you are and your response to the build-up injections. Once the maintenance dose is reached, there are longer periods between injections, typically two to four weeks.  Occasionally doctors give cortisone-type shots that can temporarily reduce allergy symptoms. These types of shots are different and should not be confused with allergy immunotherapy shots.  Who Can Be Treated with Allergy Shots?  Allergy shots may be a good treatment approach for people with allergic rhinitis (hay fever), allergic asthma, conjunctivitis (eye allergy) or stinging insect allergy.   Before deciding to begin allergy shots, you should consider:  . The length of allergy season and the severity of your symptoms . Whether medications and/or changes to your environment can control your symptoms . Your desire to avoid long-term medication use . Time: allergy immunotherapy requires a major time commitment . Cost: may vary depending on your insurance coverage  Allergy shots for children age 55 and older are effective and often well tolerated. They might prevent the onset of new allergen sensitivities or the progression to  asthma.  Allergy shots are not started on patients who are pregnant but can be continued on patients who become pregnant while receiving them. In some patients with other medical conditions or who take certain common medications, allergy shots may be of risk. It is important to mention other medications you talk to your allergist.   When Will I Feel Better?  Some may experience decreased allergy symptoms during the build-up phase. For others, it may take as long as 12 months on the maintenance dose. If there is no improvement after a year of maintenance, your allergist will discuss other treatment options with you.  If you aren't responding to allergy shots, it may be because there is not enough dose of the allergen in your vaccine or there are missing allergens that were not identified during your allergy testing. Other reasons could be that there are  high levels of the allergen in your environment or major exposure to non-allergic triggers like tobacco smoke.  What Is the Length of Treatment?  Once the maintenance dose is reached, allergy shots are generally continued for three to five years. The decision to stop should be discussed with your allergist at that time. Some people may experience a permanent reduction of allergy symptoms. Others may relapse and a longer course of allergy shots can be considered.  What Are the Possible Reactions?  The two types of adverse reactions that can occur with allergy shots are local and systemic. Common local reactions include very mild redness and swelling at the injection site, which can happen immediately or several hours after. A systemic reaction, which is less common, affects the entire body or a particular body system. They are usually mild and typically respond quickly to medications. Signs include increased allergy symptoms such as sneezing, a stuffy nose or hives.  Rarely, a serious systemic reaction called anaphylaxis can develop. Symptoms include  swelling in the throat, wheezing, a feeling of tightness in the chest, nausea or dizziness. Most serious systemic reactions develop within 30 minutes of allergy shots. This is why it is strongly recommended you wait in your doctor's office for 30 minutes after your injections. Your allergist is trained to watch for reactions, and his or her staff is trained and equipped with the proper medications to identify and treat them.  Who Should Administer Allergy Shots?  The preferred location for receiving shots is your prescribing allergist's office. Injections can sometimes be given at another facility where the physician and staff are trained to recognize and treat reactions, and have received instructions by your prescribing allergist.

## 2018-12-22 NOTE — Progress Notes (Signed)
VIALS EXP 12-22-2019 

## 2018-12-23 DIAGNOSIS — J301 Allergic rhinitis due to pollen: Secondary | ICD-10-CM | POA: Diagnosis not present

## 2018-12-24 DIAGNOSIS — J3089 Other allergic rhinitis: Secondary | ICD-10-CM | POA: Diagnosis not present

## 2018-12-27 ENCOUNTER — Telehealth: Payer: Self-pay | Admitting: Allergy & Immunology

## 2018-12-27 NOTE — Telephone Encounter (Signed)
I received a call from Ms. Albiter reporting that she was having palpitations from the [place]. I recommended that we change to Symbicort instead, but she said that she was going to be placed on that by her PCP and did not like the side effect profile. Therefore we decided to change to [facility] one puff three times weekly. She was ok with this plan. Hopefully controlling her allergies with the allergy shots will help to make her asthma better controlled. She will give [place] an update. If there continues to be problems, we will change to Arnuity instead.   Malachi Bonds, MD [facility]

## 2018-12-29 NOTE — Telephone Encounter (Signed)
Called patient this morning states she is doing well patient stopped it Saturday but could still feel heart racing (feels in throat) and having it today. Please advise.

## 2018-12-29 NOTE — Telephone Encounter (Signed)
Spoke to pt and informed her to call her PCP to be triaged over the phone.

## 2018-12-29 NOTE — Telephone Encounter (Signed)
I still feel that this is secondary to her Breo.  However, I guess I would recommend that she call her PCP to triage this over the phone.  I do not want her to go and if she does not need to, but I do not want a missed anything either.  Malachi Bonds, MD [facility]

## 2019-01-02 ENCOUNTER — Ambulatory Visit: Payer: BLUE CROSS/BLUE SHIELD

## 2019-01-05 DIAGNOSIS — R5383 Other fatigue: Secondary | ICD-10-CM | POA: Diagnosis not present

## 2019-01-05 DIAGNOSIS — M797 Fibromyalgia: Secondary | ICD-10-CM | POA: Diagnosis not present

## 2019-01-05 DIAGNOSIS — R6882 Decreased libido: Secondary | ICD-10-CM | POA: Diagnosis not present

## 2019-02-19 DIAGNOSIS — N6320 Unspecified lump in the left breast, unspecified quadrant: Secondary | ICD-10-CM | POA: Diagnosis not present

## 2019-02-20 ENCOUNTER — Other Ambulatory Visit: Payer: Self-pay | Admitting: Obstetrics and Gynecology

## 2019-02-20 DIAGNOSIS — N6320 Unspecified lump in the left breast, unspecified quadrant: Secondary | ICD-10-CM

## 2019-02-23 ENCOUNTER — Ambulatory Visit (INDEPENDENT_AMBULATORY_CARE_PROVIDER_SITE_OTHER): Payer: BLUE CROSS/BLUE SHIELD | Admitting: Internal Medicine

## 2019-03-10 ENCOUNTER — Other Ambulatory Visit: Payer: Self-pay

## 2019-03-10 ENCOUNTER — Ambulatory Visit
Admission: RE | Admit: 2019-03-10 | Discharge: 2019-03-10 | Disposition: A | Discharge status: Home / Self Care | Payer: BLUE CROSS/BLUE SHIELD | Source: Ambulatory Visit | Attending: Obstetrics and Gynecology | Admitting: Obstetrics and Gynecology

## 2019-03-10 ENCOUNTER — Other Ambulatory Visit: Payer: Self-pay | Admitting: Obstetrics and Gynecology

## 2019-03-10 DIAGNOSIS — N6320 Unspecified lump in the left breast, unspecified quadrant: Secondary | ICD-10-CM

## 2019-03-10 DIAGNOSIS — N6321 Unspecified lump in the left breast, upper outer quadrant: Secondary | ICD-10-CM | POA: Diagnosis not present

## 2019-03-20 ENCOUNTER — Ambulatory Visit: Payer: BLUE CROSS/BLUE SHIELD | Admitting: Allergy & Immunology

## 2019-03-20 ENCOUNTER — Ambulatory Visit (INDEPENDENT_AMBULATORY_CARE_PROVIDER_SITE_OTHER): Payer: BC Managed Care – PPO

## 2019-03-20 ENCOUNTER — Other Ambulatory Visit: Payer: Self-pay

## 2019-03-20 DIAGNOSIS — J309 Allergic rhinitis, unspecified: Secondary | ICD-10-CM | POA: Diagnosis not present

## 2019-03-20 NOTE — Progress Notes (Signed)
Immunotherapy   Patient Details  Name: Sabrina Clark MRN: [id number] Date of Birth: 1956/09/01  03/20/2019   Sabrina Clark started her allergy injections today. Patient received 0.05 of both her blue vials. One with G-W-D-C and the other with RW-DM-Mold. Patient waited in an exam room for 30 minutes with no problem. Following schedule: B Frequency: 1-2 times weekly Epi-Pen: Yes Consent signed and patient instructions given.   Dub Mikes 03/20/2019, 10:01 AM

## 2019-03-23 DIAGNOSIS — E785 Hyperlipidemia, unspecified: Secondary | ICD-10-CM | POA: Diagnosis not present

## 2019-03-23 DIAGNOSIS — E2839 Other primary ovarian failure: Secondary | ICD-10-CM | POA: Diagnosis not present

## 2019-03-23 DIAGNOSIS — R6882 Decreased libido: Secondary | ICD-10-CM | POA: Diagnosis not present

## 2019-03-23 DIAGNOSIS — R5383 Other fatigue: Secondary | ICD-10-CM | POA: Diagnosis not present

## 2019-03-23 DIAGNOSIS — E559 Vitamin D deficiency, unspecified: Secondary | ICD-10-CM | POA: Diagnosis not present

## 2019-03-23 DIAGNOSIS — B999 Unspecified infectious disease: Secondary | ICD-10-CM | POA: Diagnosis not present

## 2019-03-27 ENCOUNTER — Ambulatory Visit (INDEPENDENT_AMBULATORY_CARE_PROVIDER_SITE_OTHER): Payer: BC Managed Care – PPO

## 2019-03-27 DIAGNOSIS — J309 Allergic rhinitis, unspecified: Secondary | ICD-10-CM | POA: Diagnosis not present

## 2019-03-30 DIAGNOSIS — Z780 Asymptomatic menopausal state: Secondary | ICD-10-CM | POA: Diagnosis not present

## 2019-03-30 DIAGNOSIS — E559 Vitamin D deficiency, unspecified: Secondary | ICD-10-CM | POA: Diagnosis not present

## 2019-03-30 DIAGNOSIS — R6882 Decreased libido: Secondary | ICD-10-CM | POA: Diagnosis not present

## 2019-04-08 ENCOUNTER — Ambulatory Visit (INDEPENDENT_AMBULATORY_CARE_PROVIDER_SITE_OTHER): Payer: BC Managed Care – PPO

## 2019-04-08 DIAGNOSIS — J309 Allergic rhinitis, unspecified: Secondary | ICD-10-CM

## 2019-04-09 ENCOUNTER — Ambulatory Visit (INDEPENDENT_AMBULATORY_CARE_PROVIDER_SITE_OTHER): Payer: BLUE CROSS/BLUE SHIELD | Admitting: Internal Medicine

## 2019-04-22 ENCOUNTER — Other Ambulatory Visit: Payer: Self-pay

## 2019-04-22 ENCOUNTER — Ambulatory Visit (INDEPENDENT_AMBULATORY_CARE_PROVIDER_SITE_OTHER): Payer: BC Managed Care – PPO

## 2019-04-22 DIAGNOSIS — J309 Allergic rhinitis, unspecified: Secondary | ICD-10-CM

## 2019-04-29 ENCOUNTER — Other Ambulatory Visit: Payer: Self-pay

## 2019-04-29 ENCOUNTER — Ambulatory Visit (INDEPENDENT_AMBULATORY_CARE_PROVIDER_SITE_OTHER): Payer: BC Managed Care – PPO

## 2019-04-29 DIAGNOSIS — J309 Allergic rhinitis, unspecified: Secondary | ICD-10-CM

## 2019-05-06 ENCOUNTER — Other Ambulatory Visit: Payer: Self-pay

## 2019-05-06 ENCOUNTER — Ambulatory Visit (INDEPENDENT_AMBULATORY_CARE_PROVIDER_SITE_OTHER): Payer: BC Managed Care – PPO

## 2019-05-06 DIAGNOSIS — J309 Allergic rhinitis, unspecified: Secondary | ICD-10-CM | POA: Diagnosis not present

## 2019-05-15 ENCOUNTER — Ambulatory Visit (INDEPENDENT_AMBULATORY_CARE_PROVIDER_SITE_OTHER): Payer: BC Managed Care – PPO

## 2019-05-15 ENCOUNTER — Other Ambulatory Visit: Payer: Self-pay

## 2019-05-15 DIAGNOSIS — J309 Allergic rhinitis, unspecified: Secondary | ICD-10-CM | POA: Diagnosis not present

## 2019-05-22 ENCOUNTER — Ambulatory Visit (INDEPENDENT_AMBULATORY_CARE_PROVIDER_SITE_OTHER): Payer: BC Managed Care – PPO

## 2019-05-22 ENCOUNTER — Other Ambulatory Visit: Payer: Self-pay

## 2019-05-22 DIAGNOSIS — J309 Allergic rhinitis, unspecified: Secondary | ICD-10-CM

## 2019-06-03 ENCOUNTER — Ambulatory Visit (INDEPENDENT_AMBULATORY_CARE_PROVIDER_SITE_OTHER): Payer: BC Managed Care – PPO

## 2019-06-03 DIAGNOSIS — J309 Allergic rhinitis, unspecified: Secondary | ICD-10-CM

## 2019-06-17 ENCOUNTER — Ambulatory Visit (INDEPENDENT_AMBULATORY_CARE_PROVIDER_SITE_OTHER): Payer: BC Managed Care – PPO | Admitting: *Deleted

## 2019-06-17 DIAGNOSIS — J309 Allergic rhinitis, unspecified: Secondary | ICD-10-CM

## 2019-06-26 ENCOUNTER — Ambulatory Visit (INDEPENDENT_AMBULATORY_CARE_PROVIDER_SITE_OTHER): Payer: BC Managed Care – PPO

## 2019-06-26 DIAGNOSIS — J309 Allergic rhinitis, unspecified: Secondary | ICD-10-CM | POA: Diagnosis not present

## 2019-07-01 ENCOUNTER — Encounter (INDEPENDENT_AMBULATORY_CARE_PROVIDER_SITE_OTHER): Payer: Self-pay | Admitting: Internal Medicine

## 2019-07-01 ENCOUNTER — Other Ambulatory Visit: Payer: Self-pay

## 2019-07-01 ENCOUNTER — Ambulatory Visit (INDEPENDENT_AMBULATORY_CARE_PROVIDER_SITE_OTHER): Payer: BC Managed Care – PPO | Admitting: *Deleted

## 2019-07-01 ENCOUNTER — Ambulatory Visit (INDEPENDENT_AMBULATORY_CARE_PROVIDER_SITE_OTHER): Payer: BC Managed Care – PPO | Admitting: Internal Medicine

## 2019-07-01 VITALS — BP 120/58 | HR 60 | Ht 61.0 in | Wt 114.0 lb

## 2019-07-01 DIAGNOSIS — J4540 Moderate persistent asthma, uncomplicated: Secondary | ICD-10-CM

## 2019-07-01 DIAGNOSIS — J3089 Other allergic rhinitis: Secondary | ICD-10-CM

## 2019-07-01 DIAGNOSIS — R5381 Other malaise: Secondary | ICD-10-CM | POA: Insufficient documentation

## 2019-07-01 DIAGNOSIS — E559 Vitamin D deficiency, unspecified: Secondary | ICD-10-CM | POA: Diagnosis not present

## 2019-07-01 DIAGNOSIS — J309 Allergic rhinitis, unspecified: Secondary | ICD-10-CM

## 2019-07-01 DIAGNOSIS — Z13220 Encounter for screening for lipoid disorders: Secondary | ICD-10-CM

## 2019-07-01 DIAGNOSIS — E2839 Other primary ovarian failure: Secondary | ICD-10-CM | POA: Insufficient documentation

## 2019-07-01 DIAGNOSIS — Z0001 Encounter for general adult medical examination with abnormal findings: Secondary | ICD-10-CM | POA: Diagnosis not present

## 2019-07-01 DIAGNOSIS — M797 Fibromyalgia: Secondary | ICD-10-CM

## 2019-07-01 DIAGNOSIS — R5383 Other fatigue: Secondary | ICD-10-CM

## 2019-07-01 DIAGNOSIS — Z1159 Encounter for screening for other viral diseases: Secondary | ICD-10-CM

## 2019-07-01 DIAGNOSIS — J45909 Unspecified asthma, uncomplicated: Secondary | ICD-10-CM | POA: Insufficient documentation

## 2019-07-01 DIAGNOSIS — J302 Other seasonal allergic rhinitis: Secondary | ICD-10-CM

## 2019-07-01 DIAGNOSIS — J4520 Mild intermittent asthma, uncomplicated: Secondary | ICD-10-CM

## 2019-07-01 DIAGNOSIS — Z131 Encounter for screening for diabetes mellitus: Secondary | ICD-10-CM | POA: Diagnosis not present

## 2019-07-01 HISTORY — DX: Fibromyalgia: M79.7

## 2019-07-01 NOTE — Progress Notes (Signed)
Chief Complaint: This 63 year old lady comes in for annual physical exam and to address her chronic conditions which are described below. HPI: She has a history of fibromyalgia, asthma and allergic rhinitis.  She also in the past has had symptoms relating to menopause including hot flashes, decreased libido.  As a result, she is on bioidentical hormone therapy and feels absolutely great since being on them.  She also had issues with insomnia and this has been helped significantly by the use of progesterone therapy. She denies any chest pain, dyspnea, palpitations or limb weakness. She feels that her fibromyalgia now also is not a major issue. She also has vitamin D deficiency and takes vitamin D3 supplementation.  Past Medical History:  Diagnosis Date  . Asthma   . Female hypogonadism syndrome   . Fibromyalgia 07/01/2019  . Malaise and fatigue   . Vitamin D deficiency disease    Past Surgical History:  Procedure Laterality Date  . CYSTECTOMY       Social History   Social History Narrative   Married 11 years,third.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        Allergies:  Allergies  Allergen Reactions  . Codeine Hives    Itch    . Cefdinir   . Diphenhydramine Hcl Hives     Current Meds  Medication Sig  . butalbital-acetaminophen-caffeine (FIORICET, ESGIC) 50-325-40 MG tablet   . Cholecalciferol (VITAMIN D3) 250 MCG (10000 UT) TABS Take 4,000 Units by mouth.   . clonazePAM (KLONOPIN) 0.5 MG tablet   . EPINEPHrine (AUVI-Q) 0.3 mg/0.3 mL IJ SOAJ injection Inject 0.3 mLs (0.3 mg total) into the muscle as needed for anaphylaxis.  Marland Kitchen. estradiol (ESTRACE) 1 MG tablet Take 1.5 mg by mouth daily.   . fluticasone furoate-vilanterol (BREO ELLIPTA) 100-25 MCG/INH AEPB Inhale 1 puff into the lungs daily.  Marland Kitchen. guaiFENesin (MUCINEX) 600 MG 12 hr tablet Take 1,200 mg by mouth 2 (two) times daily.  . Misc Natural Products (NF FORMULAS TESTOSTERONE) CAPS Take 15 mg by mouth daily at 12 noon.  Compounded  . NP THYROID 60 MG tablet Take 60 mg by mouth daily before breakfast.   . PROAIR HFA 108 (90 Base) MCG/ACT inhaler   . progesterone (PROMETRIUM) 200 MG capsule Take 200 mg by mouth daily.   Marland Kitchen. triamcinolone (NASACORT) 55 MCG/ACT AERO nasal inhaler Place 1 spray into the nose daily.     Nutrition Overall, she tries to eat a healthy diet.  Sleep She sleeps extremely well, achieving 7 to 8 hours sleep every night uninterrupted.  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  Bio-identical hormones Testosterone therapy is being used off label for symptoms of testosterone deficiency and benefits that it produces based on several studies.  These benefits include decreasing body fat, increasing in lean muscle mass and increasing in bone density.  There is improvement of memory, cognition.  There is improvement in exercise tolerance and endurance.  Testosterone therapy has also been shown to be protective against coronary artery disease, cerebrovascular disease, diabetes, hypertension and degenerative joint disease. I have discussed with the patient the FDA warnings regarding testosterone therapy, benefits and side effects and modes of administration as well as monitoring blood levels and side effects  on a regular basis The patient is agreeable that testosterone therapy should be an integral part of his/her wellness,quality of life and prevention of chronic disease.  Estradiol is being used in this patient  for multiple benefits based on several studies including protection against heart disease, cerebrovascular disease, osteoporosis, colon cancer, Alzheimer's disease, macular degeneration and cataracts. The patient has been counseled regarding benefits and side effects and modes of administration. The patient is agreeable that this therapy is an integral to part of her wellness, quality of life and prevention of chronic disease.   Micronized progesterone is being used in this patient for multiple benefits based on studies including protection against uterine cancer, breast cancer, osteoporosis and heart disease. The patient has been counseled regarding side effects, benefits and modes of administration. The patient is agreeable that this therapy is an integral part of her wellness, quality of life and prevention of chronic disease.  This patient is being treated with desiccated thyroid, off label, for symptoms of thyroid deficiency.  The patient has been counseled regarding side effects and how to deal with them.  BJS:EGBTD from the symptoms mentioned above,there are no other symptoms referable to all systems reviewed.  Physical Exam: Blood pressure (!) 120/58, pulse 60, height 5\' 1"  (1.549 m), weight 114 lb (51.7 kg). Vitals with BMI 07/01/2019 12/19/2018 01/15/2016  Height 5\' 1"  5' 0.63" 5\' 0"   Weight 114 lbs 125 lbs 130 lbs  BMI 21.55 17.61 60.7  Systolic 371 062 694  Diastolic 58 64 72  Pulse 60 86 108      She looks systemically well. General: Alert, cooperative, and appears to be the stated age.No pallor.  No jaundice.  No clubbing. Head: Normocephalic Eyes: Sclera white, pupils equal and reactive to light, red reflex x 2,  Ears: Normal bilaterally Oral cavity: Lips, mucosa, and tongue normal: Teeth and gums normal Neck: No adenopathy, supple, symmetrical, trachea midline, and thyroid does not appear enlarged Breast: No masses felt although her breasts are cystic. Respiratory: Clear to auscultation bilaterally.No wheezing, crackles or bronchial breathing. Cardiovascular: Heart sounds are present and appear to be normal without murmurs or added sounds.  No carotid bruits.  Peripheral pulses are present and equal bilaterally.: Soft, nontender, Gastrointestinal:positive bowel sounds, no hepatosplenomegaly.  No masses felt.No tenderness. Skin: Clear, No rashes noted.No worrisome skin lesions seen. Neurological:  Grossly intact without focal findings, cranial nerves II through XII intact, muscle strength equal bilaterally Musculoskeletal: No acute joint abnormalities noted.Full range of movement noted with joints. Psychiatric: Affect appropriate, non-anxious.    Assessment  1. Moderate persistent asthma, uncomplicated   2. Fibromyalgia   3. Seasonal and perennial allergic rhinitis   4. Female hypogonadism syndrome   5. Vitamin D deficiency disease   6. Screening for lipoid disorders   7. Screening for diabetes mellitus   8. Encounter for hepatitis C screening test for low risk patient   9. Malaise and fatigue   10. Mild intermittent asthma without complication       Plan  1. Healthy 63 year old lady overall. 2. She will continue with all medications for chronic conditions above. 3. Blood work is ordered as below. 4. Further recommendations will depend on blood results and I will see her in about 6 months time for follow-up. 5. Today, in addition to a preventative visit, I performed an office visit to address her chronic conditions and symptoms above.      Tests Ordered:   Orders Placed This Encounter  Procedures  . CBC  . COMPLETE METABOLIC PANEL WITH GFR  . Estradiol  . Hemoglobin A1c  . Hepatitis C antibody  . Lipid panel  . Progesterone  . T3, free  . TSH  .  Testos,Total,Free and SHBG (Female)  . VITAMIN D 25 Hydroxy (Vit-D Deficiency, Fractures)     Julyan Gales C Kara Mierzejewski   07/01/2019, 12:02 PM

## 2019-07-02 NOTE — Progress Notes (Signed)
All your blood tests are great, continue with the same medications.  If you have any questions, please do not hesitate to contact me through my chart.  See you next time, be well!Marland Kitchen

## 2019-07-10 ENCOUNTER — Ambulatory Visit (INDEPENDENT_AMBULATORY_CARE_PROVIDER_SITE_OTHER): Payer: BC Managed Care – PPO

## 2019-07-10 DIAGNOSIS — J309 Allergic rhinitis, unspecified: Secondary | ICD-10-CM | POA: Diagnosis not present

## 2019-07-11 LAB — COMPLETE METABOLIC PANEL WITH GFR
AG Ratio: 1.5 (calc) (ref 1.0–2.5)
ALT: 9 U/L (ref 6–29)
AST: 19 U/L (ref 10–35)
Albumin: 4 g/dL (ref 3.6–5.1)
Alkaline phosphatase (APISO): 50 U/L (ref 37–153)
BUN: 8 mg/dL (ref 7–25)
CO2: 24 mmol/L (ref 20–32)
Calcium: 9.1 mg/dL (ref 8.6–10.4)
Chloride: 105 mmol/L (ref 98–110)
Creat: 0.85 mg/dL (ref 0.50–0.99)
GFR, Est African American: 86 mL/min/{1.73_m2} (ref >=60)
GFR, Est Non African American: 74 mL/min/{1.73_m2} (ref >=60)
Globulin: 2.6 g/dL (calc) (ref 1.9–3.7)
Glucose, Bld: 79 mg/dL (ref 65–99)
Potassium: 4.6 mmol/L (ref 3.5–5.3)
Sodium: 140 mmol/L (ref 135–146)
Total Bilirubin: 0.4 mg/dL (ref 0.2–1.2)
Total Protein: 6.6 g/dL (ref 6.1–8.1)

## 2019-07-11 LAB — TSH: TSH: 1.36 mIU/L (ref 0.40–4.50)

## 2019-07-11 LAB — CBC
HCT: 39.1 % (ref 35.0–45.0)
Hemoglobin: 12.9 g/dL (ref 11.7–15.5)
MCH: 30.9 pg (ref 27.0–33.0)
MCHC: 33 g/dL (ref 32.0–36.0)
MCV: 93.8 fL (ref 80.0–100.0)
MPV: 11.1 fL (ref 7.5–12.5)
Platelets: 263 10*3/uL (ref 140–400)
RBC: 4.17 10*6/uL (ref 3.80–5.10)
RDW: 12.5 % (ref 11.0–15.0)
WBC: 9 10*3/uL (ref 3.8–10.8)

## 2019-07-11 LAB — HEMOGLOBIN A1C
Hgb A1c MFr Bld: 5.2 % of total Hgb (ref <5.7)
Mean Plasma Glucose: 103 (calc)
eAG (mmol/L): 5.7 (calc)

## 2019-07-11 LAB — LIPID PANEL
Cholesterol: 165 mg/dL (ref <200)
HDL: 63 mg/dL (ref >=50)
LDL Cholesterol (Calc): 86 mg/dL (calc)
Non-HDL Cholesterol (Calc): 102 mg/dL (calc) (ref <130)
Total CHOL/HDL Ratio: 2.6 (calc) (ref <5.0)
Triglycerides: 69 mg/dL (ref <150)

## 2019-07-11 LAB — TESTOS,TOTAL,FREE AND SHBG (FEMALE)
Free Testosterone: 42.5 pg/mL — ABNORMAL HIGH (ref 0.1–6.4)
Sex Hormone Binding: 144 nmol/L — ABNORMAL HIGH (ref 14–73)
Testosterone, Total, LC-MS-MS: 746 ng/dL — ABNORMAL HIGH (ref 2–45)

## 2019-07-11 LAB — VITAMIN D 25 HYDROXY (VIT D DEFICIENCY, FRACTURES): Vit D, 25-Hydroxy: 51 ng/mL (ref 30–100)

## 2019-07-11 LAB — ESTRADIOL: Estradiol: 64 pg/mL

## 2019-07-11 LAB — HEPATITIS C ANTIBODY
Hepatitis C Ab: NONREACTIVE
SIGNAL TO CUT-OFF: 0.01 (ref <1.00)

## 2019-07-11 LAB — PROGESTERONE: Progesterone: 41.7 ng/mL

## 2019-07-11 LAB — T3, FREE: T3, Free: 4.6 pg/mL — ABNORMAL HIGH (ref 2.3–4.2)

## 2019-07-12 NOTE — Progress Notes (Signed)
All your blood tests are great.  Testosterone levels are fantastic.  Vitamin D levels are still suboptimal so I want you to increase vitamin D3 so that you are taking 10,000 units daily.  If you have any questions, please do not hesitate to contact me through my chart.  See you next time, be well!

## 2019-07-15 ENCOUNTER — Ambulatory Visit (INDEPENDENT_AMBULATORY_CARE_PROVIDER_SITE_OTHER): Payer: BC Managed Care – PPO

## 2019-07-15 DIAGNOSIS — J309 Allergic rhinitis, unspecified: Secondary | ICD-10-CM

## 2019-07-24 ENCOUNTER — Ambulatory Visit (INDEPENDENT_AMBULATORY_CARE_PROVIDER_SITE_OTHER): Payer: BC Managed Care – PPO

## 2019-07-24 DIAGNOSIS — J309 Allergic rhinitis, unspecified: Secondary | ICD-10-CM | POA: Diagnosis not present

## 2019-07-31 ENCOUNTER — Other Ambulatory Visit (INDEPENDENT_AMBULATORY_CARE_PROVIDER_SITE_OTHER): Payer: Self-pay | Admitting: Internal Medicine

## 2019-08-03 ENCOUNTER — Other Ambulatory Visit (INDEPENDENT_AMBULATORY_CARE_PROVIDER_SITE_OTHER): Payer: Self-pay | Admitting: Internal Medicine

## 2019-08-03 MED ORDER — PROGESTERONE MICRONIZED 200 MG PO CAPS: 200.0000 mg | ORAL_CAPSULE | Freq: Every day | ORAL | 0 refills | Status: DC

## 2019-08-12 ENCOUNTER — Ambulatory Visit (INDEPENDENT_AMBULATORY_CARE_PROVIDER_SITE_OTHER): Payer: BC Managed Care – PPO | Admitting: *Deleted

## 2019-08-12 DIAGNOSIS — J309 Allergic rhinitis, unspecified: Secondary | ICD-10-CM

## 2019-08-21 ENCOUNTER — Ambulatory Visit (INDEPENDENT_AMBULATORY_CARE_PROVIDER_SITE_OTHER): Payer: BC Managed Care – PPO

## 2019-08-21 DIAGNOSIS — J309 Allergic rhinitis, unspecified: Secondary | ICD-10-CM

## 2019-09-02 ENCOUNTER — Ambulatory Visit (INDEPENDENT_AMBULATORY_CARE_PROVIDER_SITE_OTHER): Payer: BC Managed Care – PPO

## 2019-09-02 DIAGNOSIS — J309 Allergic rhinitis, unspecified: Secondary | ICD-10-CM | POA: Diagnosis not present

## 2019-09-08 ENCOUNTER — Other Ambulatory Visit (INDEPENDENT_AMBULATORY_CARE_PROVIDER_SITE_OTHER): Payer: Self-pay | Admitting: Internal Medicine

## 2019-09-09 ENCOUNTER — Ambulatory Visit (INDEPENDENT_AMBULATORY_CARE_PROVIDER_SITE_OTHER): Payer: BC Managed Care – PPO

## 2019-09-09 DIAGNOSIS — J309 Allergic rhinitis, unspecified: Secondary | ICD-10-CM

## 2019-09-15 ENCOUNTER — Other Ambulatory Visit: Payer: BLUE CROSS/BLUE SHIELD

## 2019-09-18 ENCOUNTER — Ambulatory Visit (INDEPENDENT_AMBULATORY_CARE_PROVIDER_SITE_OTHER): Payer: BC Managed Care – PPO

## 2019-09-18 DIAGNOSIS — J309 Allergic rhinitis, unspecified: Secondary | ICD-10-CM | POA: Diagnosis not present

## 2019-09-25 ENCOUNTER — Ambulatory Visit (INDEPENDENT_AMBULATORY_CARE_PROVIDER_SITE_OTHER): Payer: BC Managed Care – PPO

## 2019-09-25 DIAGNOSIS — J309 Allergic rhinitis, unspecified: Secondary | ICD-10-CM

## 2019-10-02 ENCOUNTER — Ambulatory Visit (INDEPENDENT_AMBULATORY_CARE_PROVIDER_SITE_OTHER): Payer: BC Managed Care – PPO

## 2019-10-02 DIAGNOSIS — J309 Allergic rhinitis, unspecified: Secondary | ICD-10-CM

## 2019-10-14 ENCOUNTER — Ambulatory Visit (INDEPENDENT_AMBULATORY_CARE_PROVIDER_SITE_OTHER): Payer: BC Managed Care – PPO

## 2019-10-14 ENCOUNTER — Other Ambulatory Visit (INDEPENDENT_AMBULATORY_CARE_PROVIDER_SITE_OTHER): Payer: Self-pay | Admitting: Internal Medicine

## 2019-10-14 DIAGNOSIS — J309 Allergic rhinitis, unspecified: Secondary | ICD-10-CM | POA: Diagnosis not present

## 2019-10-20 DIAGNOSIS — Z808 Family history of malignant neoplasm of other organs or systems: Secondary | ICD-10-CM | POA: Diagnosis not present

## 2019-10-20 DIAGNOSIS — G43909 Migraine, unspecified, not intractable, without status migrainosus: Secondary | ICD-10-CM | POA: Insufficient documentation

## 2019-10-20 DIAGNOSIS — R87810 Cervical high risk human papillomavirus (HPV) DNA test positive: Secondary | ICD-10-CM | POA: Diagnosis not present

## 2019-10-20 DIAGNOSIS — R339 Retention of urine, unspecified: Secondary | ICD-10-CM | POA: Diagnosis not present

## 2019-10-20 DIAGNOSIS — Z8049 Family history of malignant neoplasm of other genital organs: Secondary | ICD-10-CM | POA: Diagnosis not present

## 2019-10-20 DIAGNOSIS — Z6821 Body mass index (BMI) 21.0-21.9, adult: Secondary | ICD-10-CM | POA: Diagnosis not present

## 2019-10-20 DIAGNOSIS — Z79890 Hormone replacement therapy (postmenopausal): Secondary | ICD-10-CM | POA: Diagnosis not present

## 2019-10-20 DIAGNOSIS — Z01419 Encounter for gynecological examination (general) (routine) without abnormal findings: Secondary | ICD-10-CM | POA: Diagnosis not present

## 2019-10-21 ENCOUNTER — Ambulatory Visit (INDEPENDENT_AMBULATORY_CARE_PROVIDER_SITE_OTHER): Payer: BC Managed Care – PPO

## 2019-10-21 DIAGNOSIS — J309 Allergic rhinitis, unspecified: Secondary | ICD-10-CM

## 2019-10-22 LAB — HM PAP SMEAR
HM Pap smear: NORMAL
HPV, high-risk: NEGATIVE

## 2019-10-28 ENCOUNTER — Telehealth: Payer: Self-pay

## 2019-10-28 ENCOUNTER — Ambulatory Visit (INDEPENDENT_AMBULATORY_CARE_PROVIDER_SITE_OTHER): Payer: BC Managed Care – PPO

## 2019-10-28 DIAGNOSIS — J309 Allergic rhinitis, unspecified: Secondary | ICD-10-CM | POA: Diagnosis not present

## 2019-10-28 NOTE — Telephone Encounter (Signed)
Sabrina Clark returned to office shortly after receiving her allergy injections today. Patient had throat itchiness/scracthy feeling along with what she described as an almost tight feeling. She also felt like something was not right. Dr. Dellis Anes was brought to the room. Patient was taken to a room and vitals checked. Per Dr. Ellouise Newer orders patient was given 10 mL cetirizine syrup and 30 mg prednisone tablets. Patient was monitored in office and discharged at  12:25 pm stable with no more symptoms.   Dr. Dellis Anes reviewed her immunotherapy dosing schedule and gave the following instructions:  Restart Red vial at 0.05cc schedule A. Repeat each dose twice before increasing.  Follow up call placed to patient later in the evening and patient stated that she is doing fine and does not have any other issues right now. I told her that if she had any symptoms return to please give [place] a call so that we could take care of her.

## 2019-10-29 NOTE — Telephone Encounter (Signed)
Agree with the plan. Discussed with Kayla yesterday during the episode.   Malachi Bonds, MD [facility]

## 2019-11-06 ENCOUNTER — Ambulatory Visit (INDEPENDENT_AMBULATORY_CARE_PROVIDER_SITE_OTHER): Payer: BC Managed Care – PPO

## 2019-11-06 DIAGNOSIS — J309 Allergic rhinitis, unspecified: Secondary | ICD-10-CM

## 2019-11-16 DIAGNOSIS — M79642 Pain in left hand: Secondary | ICD-10-CM | POA: Diagnosis not present

## 2019-11-16 DIAGNOSIS — M79641 Pain in right hand: Secondary | ICD-10-CM | POA: Diagnosis not present

## 2019-11-18 ENCOUNTER — Ambulatory Visit (INDEPENDENT_AMBULATORY_CARE_PROVIDER_SITE_OTHER): Payer: BC Managed Care – PPO

## 2019-11-18 DIAGNOSIS — J309 Allergic rhinitis, unspecified: Secondary | ICD-10-CM

## 2019-11-19 DIAGNOSIS — J301 Allergic rhinitis due to pollen: Secondary | ICD-10-CM | POA: Diagnosis not present

## 2019-11-19 NOTE — Progress Notes (Signed)
VIALS EXP 11-18-20 

## 2019-11-20 ENCOUNTER — Telehealth: Payer: Self-pay | Admitting: Allergy & Immunology

## 2019-11-20 NOTE — Telephone Encounter (Signed)
Called patient to schedule follow up appointment, left voicemail.

## 2019-11-25 ENCOUNTER — Ambulatory Visit: Payer: Self-pay

## 2019-11-25 ENCOUNTER — Ambulatory Visit (INDEPENDENT_AMBULATORY_CARE_PROVIDER_SITE_OTHER): Payer: BC Managed Care – PPO | Admitting: Allergy & Immunology

## 2019-11-25 ENCOUNTER — Encounter: Payer: Self-pay | Admitting: Allergy & Immunology

## 2019-11-25 ENCOUNTER — Other Ambulatory Visit: Payer: Self-pay

## 2019-11-25 VITALS — BP 106/54 | HR 70 | Temp 97.9°F | Resp 16

## 2019-11-25 DIAGNOSIS — J302 Other seasonal allergic rhinitis: Secondary | ICD-10-CM

## 2019-11-25 DIAGNOSIS — J4540 Moderate persistent asthma, uncomplicated: Secondary | ICD-10-CM | POA: Diagnosis not present

## 2019-11-25 DIAGNOSIS — J309 Allergic rhinitis, unspecified: Secondary | ICD-10-CM

## 2019-11-25 DIAGNOSIS — J3089 Other allergic rhinitis: Secondary | ICD-10-CM

## 2019-11-25 NOTE — Patient Instructions (Addendum)
1. Moderate persistent asthma, uncomplicated - Lung testing looked good today, especially your FEV1. - I think the allergy shots are helping with your breathing.  - Daily controller medication(s): NOTHING - Prior to physical activity: albuterol 2 puffs 10-15 minutes before physical activity. - Rescue medications: albuterol 4 puffs every 4-6 hours as needed - Asthma control goals:  * Full participation in all desired activities (may need albuterol before activity) * Albuterol use two time or less a week on average (not counting use with activity) * Cough interfering with sleep two time or less a month * Oral steroids no more than once a year * No hospitalizations  2. Chronic rhinitis (ragweed, weeds, grasses, indoor molds, dust mites, cat and dog) - Continue with allergy shots at the same schedule. - I am so glad that you have done so well with the allergy shots.  - We are starting epi rinses today, which will hopefully decrease the local reactions and allow [place] to keep pushing up on your allergy shot doses.  - Continue with: Allegra (fexofenadine) 180mg  table once daily and Nasacort (triamcinolone) one spray per nostril daily  3. Return in about 6 months (around 05/24/2020). This can be an in-person, a virtual Webex or a telephone follow up visit.   Please inform 07/24/2020 of any Emergency Department visits, hospitalizations, or changes in symptoms. Call us before going to the ED for breathing or allergy symptoms since we might be able to fit you in for a sick visit. Feel free to contact us anytime with any questions, problems, or concerns.  It was a pleasure to see you and your mom again today! Enjoy Short Sugar!   Websites that have reliable patient information: 1. American Academy of Asthma, Allergy, and Immunology: www.aaaai.org 2. Food Allergy Research and Education (FARE): foodallergy.org 3. Mothers of Asthmatics: http://www.asthmacommunitynetwork.org 4. American College of Allergy,  Asthma, and Immunology: www.acaai.org   COVID-19 Vaccine Information can be found at: [place] For questions related to vaccine distribution or appointments, please email [email or web address] or call [phone].     "Like" [phone] on Facebook and Instagram for our latest updates!        Make sure you are registered to vote! If you have moved or changed any of your contact information, you will need to get this updated before voting!  In some cases, you MAY be able to register to vote online: [place]

## 2019-11-25 NOTE — Progress Notes (Signed)
FOLLOW UP  Date of Service/Encounter:  11/25/19   Assessment:   Moderate persistent asthma, uncomplicated   Seasonal and perennial allergic rhinitis (ragweed, weeds, grasses, indoor molds, dust mites, cat and dog)   Sabrina Clark presents for follow-up visit.  She is doing remarkably well compared to when I first met her about 1 year ago.  I did start her on Breo at the last visit since she was requiring albuterol and seemed short of breath with activities of daily living.  She did this for about 3 months before weaning herself off.  She reports that she was having some tachycardia due to the [facility].  However, with allergy shots on board, she was able to wean off and has done very well without any use of the rescue inhaler and no episodes of bronchitis, which she previously got every spring and fall.  She is very happy with how well she is doing.  We are going to add on epi rinses to her allergen immunotherapy to see if this will decrease her large local reactions.  She will give [place] an update next week.  Plan/Recommendations:    1. Moderate persistent asthma, uncomplicated - Lung testing looked good today, especially your FEV1. - I think the allergy shots are helping with your breathing.  - Daily controller medication(s): NOTHING - Prior to physical activity: albuterol 2 puffs 10-15 minutes before physical activity. - Rescue medications: albuterol 4 puffs every 4-6 hours as needed - Asthma control goals:  * Full participation in all desired activities (may need albuterol before activity) * Albuterol use two time or less a week on average (not counting use with activity) * Cough interfering with sleep two time or less a month * Oral steroids no more than once a year * No hospitalizations  2. Chronic rhinitis (ragweed, weeds, grasses, indoor molds, dust mites, cat and dog) - Continue with allergy shots at the same schedule. - I am so glad that you have done so well with the allergy shots.    - We are starting epi rinses today, which will hopefully decrease the local reactions and allow [place] to keep pushing up on your allergy shot doses.  - Continue with: Allegra (fexofenadine) 154m table once daily and Nasacort (triamcinolone) one spray per nostril daily  3. Return in about 6 months (around 05/24/2020). This can be an in-person, a virtual Webex or a telephone follow up visit.   Subjective:   Sabrina Clark a 64y.o. female presenting today for follow up of  Chief Complaint  Patient presents with  . Immunotherapy    Palm size hive after last injection.    Sabrina Panchalhas a history of the following: Patient Active Problem List   Diagnosis Date Noted  . Fibromyalgia 07/01/2019  . Vitamin D deficiency disease   . Female hypogonadism syndrome   . Asthma   . Malaise and fatigue   . Seasonal and perennial allergic rhinitis 12/19/2018  . Moderate persistent asthma, uncomplicated 052/84/1324   History obtained from: chart review and patient.  MCieannais a 64y.o. female presenting for a follow up visit.  She was last seen in March 2020 as a new patient.  At that time, her lung testing looked good but she notes she was experiencing frequent shortness of breath episodes, we added Breo 100/25 mcg 1 puff once daily.  She had testing that was positive to ragweed, weeds, grasses, indoor molds, dust mite, cat, and dog.  We continued Allegra  and started Nasacort.  She also made the decision to start allergen immunotherapy.  Since last visit, she has mostly done well.  We did see her 3 weeks ago after a delayed reaction.  She did need Zyrtec and prednisone  Asthma/Respiratory Symptom History: She remains off of the Breo one puff once daily. She is not using it at all at this point. She has not been using her rescue inhaler at all. This is the first year that she has "barely" used it. She thinks that she has used it probably less than ten times.  She sleeps well at night without wheezing  or coughing.  Allergic Rhinitis Symptom History: She remains on her allergen immunotherapy and has had a good response to this.  Aside from her systemic reaction 3 weeks ago, she has mostly done well. She had a large local reaction at the last injection. This was 5 hours after the injection itself.  She remains on the nose spray as well as the Allegra.  She has not had any episodes of bronchitis or antibiotics since we saw her in March 2020.  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  They have 2 or 3 new calves at the home.  She shows me pictures and they are quite adorable.  Otherwise, there have been no changes to her past medical history, surgical history, family history, or social history.    Review of Systems  Constitutional: Negative.  Negative for chills, fever, malaise/fatigue and weight loss.  HENT: Negative.  Negative for congestion, ear discharge, ear pain and sore throat.   Eyes: Negative for pain, discharge and redness.  Respiratory: Negative for cough, sputum production, shortness of breath and wheezing.   Cardiovascular: Negative.  Negative for chest pain and palpitations.  Gastrointestinal: Negative for abdominal pain, constipation, diarrhea, heartburn, nausea and vomiting.  Skin: Negative.  Negative for itching and rash.  Neurological: Negative for dizziness and headaches.  Endo/Heme/Allergies: Negative for environmental allergies. Does not bruise/bleed easily.       Objective:   Blood pressure (!) 106/54, pulse 70, temperature 97.9 F (36.6 C), temperature source Temporal, resp. rate 16, SpO2 99 %. There is no height or weight on file to calculate BMI.   Physical Exam:  Physical Exam  Constitutional: She appears well-developed.  Pleasant female.  Very talkative.  HENT:  Head: Normocephalic and atraumatic.  Right Ear: Tympanic membrane, external ear and ear canal normal.  Left Ear: Tympanic membrane, external ear and ear canal normal.  Nose: Mucosal edema and  rhinorrhea present. No nasal deformity or septal deviation. No epistaxis. Right sinus exhibits no maxillary sinus tenderness and no frontal sinus tenderness. Left sinus exhibits no maxillary sinus tenderness and no frontal sinus tenderness.  Mouth/Throat: Uvula is midline and oropharynx is clear and moist. Mucous membranes are not pale and not dry.  Eyes: Pupils are equal, round, and reactive to light. Conjunctivae and EOM are normal. Right eye exhibits no chemosis and no discharge. Left eye exhibits no chemosis and no discharge. Right conjunctiva is not injected. Left conjunctiva is not injected.  Cardiovascular: Normal rate, regular rhythm and normal heart sounds.  Respiratory: Effort normal and breath sounds normal. No accessory muscle usage. No tachypnea. No respiratory distress. She has no wheezes. She has no rhonchi. She has no rales. She exhibits no tenderness.  Moving air well in all lung fields.  No increased work of breathing.  Lymphadenopathy:    She has no cervical adenopathy.  Neurological: She is alert.  Skin: No abrasion, no petechiae and no rash noted. Rash is not papular, not vesicular and not urticarial. No erythema. No pallor.  No eczematous or urticarial lesions noted.  Psychiatric: She has a normal mood and affect.     Diagnostic studies:    Spirometry: results normal (FEV1: 1.88/97%, FVC: 2.25/84%, FEV1/FVC: 83%).    Spirometry consistent with normal pattern.   Allergy Studies: none        Salvatore Marvel, MD  [facility]

## 2019-12-02 ENCOUNTER — Ambulatory Visit (INDEPENDENT_AMBULATORY_CARE_PROVIDER_SITE_OTHER): Payer: BC Managed Care – PPO

## 2019-12-02 DIAGNOSIS — J309 Allergic rhinitis, unspecified: Secondary | ICD-10-CM | POA: Diagnosis not present

## 2019-12-17 ENCOUNTER — Ambulatory Visit: Payer: BC Managed Care – PPO | Attending: Internal Medicine

## 2019-12-17 DIAGNOSIS — Z23 Encounter for immunization: Secondary | ICD-10-CM | POA: Insufficient documentation

## 2019-12-17 NOTE — Progress Notes (Signed)
   Covid-19 Vaccination Clinic  Name:  Sabrina Clark    MRN: [id number] DOB: 07/07/1956  12/17/2019  Sabrina Clark was observed post Covid-19 immunization for 15 minutes without incident. She was provided with Vaccine Information Sheet and instruction to access the V-Safe system.   Sabrina Clark was instructed to call 911 with any severe reactions post vaccine: Marland Kitchen Difficulty breathing  . Swelling of face and throat  . A fast heartbeat  . A bad rash all over body  . Dizziness and weakness   Immunizations Administered    Name Date Dose VIS Date Route   Pfizer COVID-19 Vaccine 12/17/2019 10:37 AM 0.3 mL 09/25/2019 Intramuscular   Manufacturer: [employer], [employer]   Lot: [id number]   NDC: [address]-8

## 2019-12-23 ENCOUNTER — Ambulatory Visit (INDEPENDENT_AMBULATORY_CARE_PROVIDER_SITE_OTHER): Payer: BC Managed Care – PPO

## 2019-12-23 DIAGNOSIS — J309 Allergic rhinitis, unspecified: Secondary | ICD-10-CM

## 2019-12-29 ENCOUNTER — Ambulatory Visit (INDEPENDENT_AMBULATORY_CARE_PROVIDER_SITE_OTHER): Payer: Self-pay | Admitting: Internal Medicine

## 2020-01-01 ENCOUNTER — Ambulatory Visit (INDEPENDENT_AMBULATORY_CARE_PROVIDER_SITE_OTHER): Payer: BC Managed Care – PPO

## 2020-01-01 DIAGNOSIS — J309 Allergic rhinitis, unspecified: Secondary | ICD-10-CM

## 2020-01-06 ENCOUNTER — Ambulatory Visit (INDEPENDENT_AMBULATORY_CARE_PROVIDER_SITE_OTHER): Payer: BC Managed Care – PPO

## 2020-01-06 DIAGNOSIS — J309 Allergic rhinitis, unspecified: Secondary | ICD-10-CM | POA: Diagnosis not present

## 2020-01-13 ENCOUNTER — Ambulatory Visit: Payer: BC Managed Care – PPO | Attending: Internal Medicine

## 2020-01-13 DIAGNOSIS — Z23 Encounter for immunization: Secondary | ICD-10-CM

## 2020-01-13 NOTE — Progress Notes (Signed)
   Covid-19 Vaccination Clinic  Name:  Sabrina Clark    MRN: [id number] DOB: 1956/06/18  01/13/2020  [name] was observed post Covid-19 immunization for 15 minutes without incident. She was provided with Vaccine Information Sheet and instruction to access the V-Safe system.   [name] was instructed to call 911 with any severe reactions post vaccine: Marland Kitchen Difficulty breathing  . Swelling of face and throat  . A fast heartbeat  . A bad rash all over body  . Dizziness and weakness   Immunizations Administered    Name Date Dose VIS Date Route   Pfizer COVID-19 Vaccine 01/13/2020 10:17 AM 0.3 mL 09/25/2019 Intramuscular   Manufacturer: [employer], [employer]   Lot: [id number]   NDC: [address]-8

## 2020-01-20 ENCOUNTER — Other Ambulatory Visit (INDEPENDENT_AMBULATORY_CARE_PROVIDER_SITE_OTHER): Payer: Self-pay | Admitting: Internal Medicine

## 2020-01-20 ENCOUNTER — Ambulatory Visit (INDEPENDENT_AMBULATORY_CARE_PROVIDER_SITE_OTHER): Payer: BC Managed Care – PPO

## 2020-01-20 DIAGNOSIS — J309 Allergic rhinitis, unspecified: Secondary | ICD-10-CM

## 2020-01-25 DIAGNOSIS — H524 Presbyopia: Secondary | ICD-10-CM | POA: Diagnosis not present

## 2020-01-25 DIAGNOSIS — H2513 Age-related nuclear cataract, bilateral: Secondary | ICD-10-CM | POA: Diagnosis not present

## 2020-01-27 ENCOUNTER — Ambulatory Visit (INDEPENDENT_AMBULATORY_CARE_PROVIDER_SITE_OTHER): Payer: BC Managed Care – PPO

## 2020-01-27 DIAGNOSIS — J309 Allergic rhinitis, unspecified: Secondary | ICD-10-CM

## 2020-02-05 ENCOUNTER — Ambulatory Visit (INDEPENDENT_AMBULATORY_CARE_PROVIDER_SITE_OTHER): Payer: BC Managed Care – PPO

## 2020-02-05 ENCOUNTER — Other Ambulatory Visit: Payer: Self-pay

## 2020-02-05 DIAGNOSIS — J309 Allergic rhinitis, unspecified: Secondary | ICD-10-CM | POA: Diagnosis not present

## 2020-02-05 MED ORDER — EPINEPHRINE 0.3 MG/0.3ML IJ SOAJ: 0.3000 mg | INTRAMUSCULAR | 2 refills | Status: DC | PRN

## 2020-02-08 ENCOUNTER — Other Ambulatory Visit: Payer: Self-pay

## 2020-02-08 ENCOUNTER — Ambulatory Visit (INDEPENDENT_AMBULATORY_CARE_PROVIDER_SITE_OTHER): Payer: BC Managed Care – PPO | Admitting: Internal Medicine

## 2020-02-08 ENCOUNTER — Encounter (INDEPENDENT_AMBULATORY_CARE_PROVIDER_SITE_OTHER): Payer: Self-pay | Admitting: Internal Medicine

## 2020-02-08 VITALS — BP 100/55 | HR 69 | Temp 98.1°F | Ht 61.0 in | Wt 113.6 lb

## 2020-02-08 DIAGNOSIS — R5381 Other malaise: Secondary | ICD-10-CM

## 2020-02-08 DIAGNOSIS — E2839 Other primary ovarian failure: Secondary | ICD-10-CM | POA: Diagnosis not present

## 2020-02-08 DIAGNOSIS — M797 Fibromyalgia: Secondary | ICD-10-CM

## 2020-02-08 DIAGNOSIS — E559 Vitamin D deficiency, unspecified: Secondary | ICD-10-CM | POA: Diagnosis not present

## 2020-02-08 DIAGNOSIS — R5383 Other fatigue: Secondary | ICD-10-CM

## 2020-02-08 NOTE — Progress Notes (Signed)
Metrics: Intervention Frequency ACO  Documented Smoking Status Yearly  Screened one or more times in 24 months  Cessation Counseling or  Active cessation medication Past 24 months  Past 24 months   Guideline developer: UpToDate (See UpToDate for funding source) Date Released: 2014       Wellness Office Visit  Subjective:  Patient ID: Sabrina Clark, female    DOB: 1956-08-30  Age: 64 y.o. MRN: [id number]  CC: This lady comes in for follow-up of menopausal symptoms and bioidentical hormone therapy, vitamin D deficiency and fibromyalgia. HPI  She says she feels great.  She continues on bioidentical hormone therapy.  She cannot remember the dose of progesterone she is actually taking.  On the last visit, she was asked to increase the vitamin D3 to 10,000 units daily which she has not. She also tells me that her fibromyalgia is under very good control at the present time. Past Medical History:  Diagnosis Date  . Asthma   . Female hypogonadism syndrome   . Fibromyalgia 07/01/2019  . Malaise and fatigue   . Vitamin D deficiency disease       Family History  Problem Relation Age of Onset  . Graves' disease Mother   . Fibromyalgia Mother   . Allergic rhinitis Father   . Cancer Father   . Diabetes Father   . Breast cancer Neg Hx   . Angioedema Neg Hx   . Asthma Neg Hx   . Atopy Neg Hx   . Eczema Neg Hx   . Immunodeficiency Neg Hx   . Urticaria Neg Hx     Social History   Social History Narrative   Married 11 years,third.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   Social History   Tobacco Use  . Smoking status: Never Smoker  . Smokeless tobacco: Never Used  Substance Use Topics  . Alcohol use: Yes    Alcohol/week: 7.0 standard drinks    Types: 7 Glasses of wine per week    Current Meds  Medication Sig  . butalbital-acetaminophen-caffeine (FIORICET) 50-325-40 MG tablet TAKE 1 TABLET BY MOUTH ONCE DAILY AS NEEDED  . Cholecalciferol (VITAMIN D3) 250 MCG (10000 UT) TABS Take  4,000 Units by mouth.   . clonazePAM (KLONOPIN) 0.5 MG tablet   . EPINEPHrine (AUVI-Q) 0.3 mg/0.3 mL IJ SOAJ injection Inject 0.3 mLs (0.3 mg total) into the muscle as needed for anaphylaxis.  Marland Kitchen estradiol (ESTRACE) 1 MG tablet TAKE 1 & 1/2 (ONE & ONE-HALF) TABLETS BY MOUTH ONCE DAILY  . fexofenadine (ALLEGRA) 180 MG tablet Take 180 mg by mouth daily.  Marland Kitchen ibuprofen (ADVIL,MOTRIN) 800 MG tablet Take 1 tablet (800 mg total) by mouth 3 (three) times daily.  . Misc Natural Products (NF FORMULAS TESTOSTERONE) CAPS Take 15 mg by mouth daily at 12 noon. Compounded  . PROAIR HFA 108 (90 Base) MCG/ACT inhaler   . progesterone (PROMETRIUM) 100 MG capsule TAKE 3 CAPSULES BY MOUTH ONCE DAILY AT NIGHT  . progesterone (PROMETRIUM) 200 MG capsule Take 1 capsule (200 mg total) by mouth daily.  Marland Kitchen thyroid (NP THYROID) 60 MG tablet Take 60 mg by mouth daily before breakfast.  . triamcinolone (NASACORT) 55 MCG/ACT AERO nasal inhaler Place 1 spray into the nose daily.  . [DISCONTINUED] guaiFENesin (MUCINEX) 600 MG 12 hr tablet Take 1,200 mg by mouth 2 (two) times daily.  . [DISCONTINUED] NP THYROID 60 MG tablet Take 1 tablet by mouth twice daily (Patient taking differently: Take 60 mg by mouth daily. )  Objective:   Today's Vitals: BP (!) 100/55 (BP Location: Left Arm, Patient Position: Sitting, Cuff Size: Normal)   Pulse 69   Temp 98.1 F (36.7 C) (Temporal)   Ht 5\' 1"  (1.549 m)   Wt 113 lb 9.6 oz (51.5 kg)   SpO2 98%   BMI 21.46 kg/m  Vitals with BMI 02/08/2020 11/25/2019 07/01/2019  Height 5\' 1"  - 5\' 1"   Weight 113 lbs 10 oz - 114 lbs  BMI 37.10 - 62.69  Systolic 485 462 703  Diastolic 55 54 58  Pulse 69 70 60     Physical Exam   She looks systemically well.  Weight is very stable.  Blood pressure is excellent.  She is alert and orientated without any obvious focal neurological signs.    Assessment   1. Female hypogonadism syndrome   2. Fibromyalgia   3. Vitamin D deficiency disease     4. Malaise and fatigue       Tests ordered Orders Placed This Encounter  Procedures  . T3, free  . Testos,Total,Free and SHBG (Female)  . Estradiol  . Progesterone  . COMPLETE METABOLIC PANEL WITH GFR  . VITAMIN D 25 Hydroxy (Vit-D Deficiency, Fractures)     Plan: 1. Blood work is ordered above. 2. She will continue with all her bioidentical hormone therapy as above and we may need to adjust the dose depending on levels that are going to be done today. 3. I will see her in 6 months time for an annual physical exam. 4. Today I spent 30 minutes with this patient reviewing all her medications and her symptoms.   No orders of the defined types were placed in this encounter.   Doree Albee, MD

## 2020-02-08 NOTE — Addendum Note (Signed)
Addended by: Lilly Cove C on: 02/08/2020 11:30 AM   Modules accepted: Orders

## 2020-02-10 ENCOUNTER — Ambulatory Visit (INDEPENDENT_AMBULATORY_CARE_PROVIDER_SITE_OTHER): Payer: BC Managed Care – PPO

## 2020-02-10 DIAGNOSIS — J309 Allergic rhinitis, unspecified: Secondary | ICD-10-CM

## 2020-02-13 LAB — T3, FREE: T3, Free: 3.2 pg/mL (ref 2.3–4.2)

## 2020-02-13 LAB — COMPLETE METABOLIC PANEL WITH GFR
AG Ratio: 1.7 (calc) (ref 1.0–2.5)
ALT: 10 U/L (ref 6–29)
AST: 17 U/L (ref 10–35)
Albumin: 4.1 g/dL (ref 3.6–5.1)
Alkaline phosphatase (APISO): 43 U/L (ref 37–153)
BUN: 11 mg/dL (ref 7–25)
CO2: 26 mmol/L (ref 20–32)
Calcium: 9.4 mg/dL (ref 8.6–10.4)
Chloride: 106 mmol/L (ref 98–110)
Creat: 0.93 mg/dL (ref 0.50–0.99)
GFR, Est African American: 76 mL/min/{1.73_m2} (ref >=60)
GFR, Est Non African American: 66 mL/min/{1.73_m2} (ref >=60)
Globulin: 2.4 g/dL (calc) (ref 1.9–3.7)
Glucose, Bld: 90 mg/dL (ref 65–99)
Potassium: 5.2 mmol/L (ref 3.5–5.3)
Sodium: 140 mmol/L (ref 135–146)
Total Bilirubin: 0.3 mg/dL (ref 0.2–1.2)
Total Protein: 6.5 g/dL (ref 6.1–8.1)

## 2020-02-13 LAB — VITAMIN D 25 HYDROXY (VIT D DEFICIENCY, FRACTURES): Vit D, 25-Hydroxy: 105 ng/mL — ABNORMAL HIGH (ref 30–100)

## 2020-02-13 LAB — ESTRADIOL: Estradiol: 41 pg/mL

## 2020-02-13 LAB — TESTOS,TOTAL,FREE AND SHBG (FEMALE)
Free Testosterone: 9.8 pg/mL — ABNORMAL HIGH (ref 0.1–6.4)
Sex Hormone Binding: 157 nmol/L — ABNORMAL HIGH (ref 14–73)
Testosterone, Total, LC-MS-MS: 147 ng/dL — ABNORMAL HIGH (ref 2–45)

## 2020-02-13 LAB — PROGESTERONE: Progesterone: 39.5 ng/mL

## 2020-02-15 ENCOUNTER — Encounter (INDEPENDENT_AMBULATORY_CARE_PROVIDER_SITE_OTHER): Payer: Self-pay | Admitting: Internal Medicine

## 2020-02-17 ENCOUNTER — Ambulatory Visit (INDEPENDENT_AMBULATORY_CARE_PROVIDER_SITE_OTHER): Payer: BC Managed Care – PPO

## 2020-02-17 DIAGNOSIS — J309 Allergic rhinitis, unspecified: Secondary | ICD-10-CM

## 2020-02-23 ENCOUNTER — Other Ambulatory Visit (INDEPENDENT_AMBULATORY_CARE_PROVIDER_SITE_OTHER): Payer: Self-pay | Admitting: Internal Medicine

## 2020-02-24 ENCOUNTER — Ambulatory Visit (INDEPENDENT_AMBULATORY_CARE_PROVIDER_SITE_OTHER): Payer: BC Managed Care – PPO

## 2020-02-24 DIAGNOSIS — J309 Allergic rhinitis, unspecified: Secondary | ICD-10-CM | POA: Diagnosis not present

## 2020-03-04 ENCOUNTER — Ambulatory Visit (INDEPENDENT_AMBULATORY_CARE_PROVIDER_SITE_OTHER): Payer: BC Managed Care – PPO

## 2020-03-04 DIAGNOSIS — J309 Allergic rhinitis, unspecified: Secondary | ICD-10-CM

## 2020-03-09 ENCOUNTER — Ambulatory Visit (INDEPENDENT_AMBULATORY_CARE_PROVIDER_SITE_OTHER): Payer: BC Managed Care – PPO

## 2020-03-09 DIAGNOSIS — J309 Allergic rhinitis, unspecified: Secondary | ICD-10-CM

## 2020-03-16 ENCOUNTER — Other Ambulatory Visit (INDEPENDENT_AMBULATORY_CARE_PROVIDER_SITE_OTHER): Payer: Self-pay | Admitting: Internal Medicine

## 2020-03-22 ENCOUNTER — Other Ambulatory Visit (INDEPENDENT_AMBULATORY_CARE_PROVIDER_SITE_OTHER): Payer: Self-pay | Admitting: Internal Medicine

## 2020-03-23 ENCOUNTER — Ambulatory Visit (INDEPENDENT_AMBULATORY_CARE_PROVIDER_SITE_OTHER): Payer: BC Managed Care – PPO

## 2020-03-23 DIAGNOSIS — J309 Allergic rhinitis, unspecified: Secondary | ICD-10-CM | POA: Diagnosis not present

## 2020-04-01 ENCOUNTER — Ambulatory Visit (INDEPENDENT_AMBULATORY_CARE_PROVIDER_SITE_OTHER): Payer: BC Managed Care – PPO

## 2020-04-01 DIAGNOSIS — J309 Allergic rhinitis, unspecified: Secondary | ICD-10-CM

## 2020-04-04 ENCOUNTER — Other Ambulatory Visit (INDEPENDENT_AMBULATORY_CARE_PROVIDER_SITE_OTHER): Payer: Self-pay | Admitting: Internal Medicine

## 2020-04-04 MED ORDER — NF FORMULAS TESTOSTERONE PO CAPS: 15.0000 mg | ORAL_CAPSULE | Freq: Every day | ORAL | 1 refills | Status: DC

## 2020-04-06 ENCOUNTER — Ambulatory Visit (INDEPENDENT_AMBULATORY_CARE_PROVIDER_SITE_OTHER): Payer: BC Managed Care – PPO

## 2020-04-06 DIAGNOSIS — J309 Allergic rhinitis, unspecified: Secondary | ICD-10-CM | POA: Diagnosis not present

## 2020-04-13 ENCOUNTER — Ambulatory Visit (INDEPENDENT_AMBULATORY_CARE_PROVIDER_SITE_OTHER): Payer: BC Managed Care – PPO

## 2020-04-13 DIAGNOSIS — J309 Allergic rhinitis, unspecified: Secondary | ICD-10-CM | POA: Diagnosis not present

## 2020-04-26 ENCOUNTER — Other Ambulatory Visit (INDEPENDENT_AMBULATORY_CARE_PROVIDER_SITE_OTHER): Payer: Self-pay | Admitting: Internal Medicine

## 2020-04-27 ENCOUNTER — Ambulatory Visit (INDEPENDENT_AMBULATORY_CARE_PROVIDER_SITE_OTHER): Payer: BC Managed Care – PPO

## 2020-04-27 DIAGNOSIS — J309 Allergic rhinitis, unspecified: Secondary | ICD-10-CM | POA: Diagnosis not present

## 2020-05-04 ENCOUNTER — Telehealth: Payer: Self-pay

## 2020-05-04 ENCOUNTER — Ambulatory Visit (INDEPENDENT_AMBULATORY_CARE_PROVIDER_SITE_OTHER): Payer: BC Managed Care – PPO

## 2020-05-04 DIAGNOSIS — J309 Allergic rhinitis, unspecified: Secondary | ICD-10-CM

## 2020-05-04 NOTE — Telephone Encounter (Signed)
Patient states she doesn't want to continue with injections until she has an appointment with Dr. Dellis Anes, she has an appointment with him 05/11/20. She doesn't want to make new vials.

## 2020-05-11 ENCOUNTER — Ambulatory Visit: Payer: Self-pay

## 2020-05-11 ENCOUNTER — Other Ambulatory Visit: Payer: Self-pay

## 2020-05-11 ENCOUNTER — Encounter: Payer: Self-pay | Admitting: Allergy & Immunology

## 2020-05-11 ENCOUNTER — Ambulatory Visit (INDEPENDENT_AMBULATORY_CARE_PROVIDER_SITE_OTHER): Payer: BC Managed Care – PPO | Admitting: Allergy & Immunology

## 2020-05-11 VITALS — BP 124/50 | HR 92 | Temp 97.3°F | Resp 16 | Wt 115.0 lb

## 2020-05-11 DIAGNOSIS — J302 Other seasonal allergic rhinitis: Secondary | ICD-10-CM

## 2020-05-11 DIAGNOSIS — J4520 Mild intermittent asthma, uncomplicated: Secondary | ICD-10-CM | POA: Diagnosis not present

## 2020-05-11 DIAGNOSIS — J309 Allergic rhinitis, unspecified: Secondary | ICD-10-CM

## 2020-05-11 DIAGNOSIS — J3089 Other allergic rhinitis: Secondary | ICD-10-CM

## 2020-05-11 MED ORDER — ALBUTEROL SULFATE HFA 108 (90 BASE) MCG/ACT IN AERS
2.0000 | INHALATION_SPRAY | RESPIRATORY_TRACT | 1 refills | Status: DC | PRN
Start: 2020-05-11 — End: 2021-07-13

## 2020-05-11 NOTE — Patient Instructions (Addendum)
1. Mild intermittent asthma, uncomplicated - Lung testing looked good today, especially your FEV1.  - Daily controller medication(s): NOTHING - Prior to physical activity: albuterol 2 puffs 10-15 minutes before physical activity. - Rescue medications: albuterol 4 puffs every 4-6 hours as needed - Asthma control goals:  * Full participation in all desired activities (may need albuterol before activity) * Albuterol use two time or less a week on average (not counting use with activity) * Cough interfering with sleep two time or less a month * Oral steroids no more than once a year * No hospitalizations  2.Seasonal and perennial allergic rhinitis (ragweed, weeds, grasses, indoor molds, dust mites, cat and dog) - Continue with allergy shots frozen at 0.15 cc every week - We are starting epi rinses today, which will hopefully decrease the local reactions and allow [place] to keep pushing up on your allergy shot doses.  - Continue with: Allegra (fexofenadine) 180mg  table once daily and Nasacort (triamcinolone) one spray per nostril daily. May take an extra dose of Allegra on the days your allergy symptoms are worse.  Please let know if this treatment plan does not work well for you. Schedule follow up appointment in   Please inform us of any Emergency Department visits, hospitalizations, or changes in symptoms. Call us before going to the ED for breathing or allergy symptoms since we might be able to fit you in for a sick visit. Feel free to contact us anytime with any questions, problems, or concerns.  It was a pleasure to see you and your mom again today! Enjoy Short Sugar!   Websites that have reliable patient information: 1. American Academy of Asthma, Allergy, and Immunology: www.aaaai.org 2. Food Allergy Research and Education (FARE): foodallergy.org 3. Mothers of Asthmatics: http://www.asthmacommunitynetwork.org 4. American College of Allergy, Asthma, and Immunology:  www.acaai.org   COVID-19 Vaccine Information can be found at: [place] For questions related to vaccine distribution or appointments, please email [email or web address] or call [phone].     "Like" [phone] on Facebook and Instagram for our latest updates!        Make sure you are registered to vote! If you have moved or changed any of your contact information, you will need to get this updated before voting!  In some cases, you MAY be able to register to vote online: [place]

## 2020-05-11 NOTE — Progress Notes (Signed)
[address] [place] [address] Dept: [phone]  FOLLOW UP NOTE  Patient ID: Sabrina Clark, female    DOB: 05/27/1956  Age: 64 y.o. MRN: [id number] Date of Office Visit: 05/11/2020  Assessment  Chief Complaint: Asthma  HPI Sabrina Clark is a 64 year old female who presents for follow-up of moderate persistent asthma and allergic rhinitis.  Moderate persistent asthma is reported as well controlled with the use of albuterol as needed.  She denies any coughing, wheezing, tightness in chest, shortness of breath and nocturnal awakenings.  She also denies any systemic steroid use since her last office visit and has not required any trips to the emergency room or urgent care due to her breathing issues.  She reports rare use of albuterol and has maybe used her albuterol twice in the past 3 months.  Allergic rhinitis is reported as controlled with the use of Nasacort nasal spray and Allegra 180 g once a day.  She reports occasional itchy watery eyes for which her eyedrops help and denies any rhinorrhea, nasal congestion and postnasal drip.  She reports that since being on her new frozen dose of 0.15 cc of 1:100 she has had less reactions, but will still have swelling a little at the injection site and may be a welt.  She no longer will feel yucky with this no lower allergy injection dose.  Prior to freezing her at this dose she would feel bad the night of her allergy injections and have no energy.  She reports that last summer she was stung by a bee 3 times in 1 week and had large local swelling of her hand.  She denied any difficulty breathing swallowing, rashes or abdominal pain.  Current medications are as listed in the chart.   Drug Allergies:  Allergies  Allergen Reactions  . Codeine Hives    Itch    . Cefdinir   . Diphenhydramine Hcl Hives    Review of Systems: Review of Systems  Constitutional: Negative for chills and fever.  HENT: Negative for nosebleeds  and sore throat.   Eyes: Negative for blurred vision.  Respiratory: Negative for cough, shortness of breath and wheezing.   Cardiovascular: Negative for chest pain and palpitations.  Gastrointestinal: Negative for abdominal pain and heartburn.  Genitourinary: Negative for dysuria.  Skin: Negative for itching and rash.  Neurological: Positive for headaches.  Endo/Heme/Allergies: Positive for environmental allergies.    Physical Exam: BP (!) 124/50   Pulse 92   Temp (!) 97.3 F (36.3 C) (Temporal)   Resp 16   Wt 115 lb (52.2 kg)   SpO2 98%   BMI 21.73 kg/m    Physical Exam Constitutional:      Appearance: Normal appearance.  HENT:     Head: Normocephalic and atraumatic.     Right Ear: Tympanic membrane, ear canal and external ear normal.     Left Ear: Tympanic membrane, ear canal and external ear normal.     Nose: Nose normal.     Mouth/Throat:     Mouth: Mucous membranes are moist.     Pharynx: Oropharynx is clear.  Eyes:     Conjunctiva/sclera: Conjunctivae normal.  Cardiovascular:     Rate and Rhythm: Regular rhythm.  Pulmonary:     Effort: Pulmonary effort is normal.     Breath sounds: Normal breath sounds.     Comments: Lungs clear to auscultation Musculoskeletal:     Cervical back: Neck supple.  Skin:    General: Skin  is warm.  Neurological:     Mental Status: She is alert and oriented to person, place, and time.  Psychiatric:        Mood and Affect: Mood normal.        Behavior: Behavior normal.        Thought Content: Thought content normal.        Judgment: Judgment normal.     Diagnostics: FVC 2.35 L, FEV1 1.87 L.  Predicted FVC 2.89 L, FEV1 2.23 L.  Spirometry indicates normal ventilatory function.  Assessment and Plan: 1. Mild intermittent asthma without complication   2. Seasonal and perennial allergic rhinitis     Meds ordered this encounter  Medications  . albuterol (VENTOLIN HFA) 108 (90 Base) MCG/ACT inhaler    Sig: Inhale 2 puffs into  the lungs every 4 (four) hours as needed for wheezing or shortness of breath.    Dispense:  18 g    Refill:  1     1. Mild intermittent asthma, uncomplicated - Lung testing looked good today, especially your FEV1.  - Daily controller medication(s): NOTHING - Prior to physical activity: albuterol 2 puffs 10-15 minutes before physical activity. - Rescue medications: albuterol 4 puffs every 4-6 hours as needed - Asthma control goals:  * Full participation in all desired activities (may need albuterol before activity) * Albuterol use two time or less a week on average (not counting use with activity) * Cough interfering with sleep two time or less a month * Oral steroids no more than once a year * No hospitalizations  2.Seasonal and perennial allergic rhinitis (ragweed, weeds, grasses, indoor molds, dust mites, cat and dog) - Continue with allergy shots frozen at 0.15 cc every week - We are starting epi rinses today, which will hopefully decrease the local reactions and allow [place] to keep pushing up on your allergy shot doses.  - Continue with: Allegra (fexofenadine) 180mg  table once daily and Nasacort (triamcinolone) one spray per nostril daily. May take an extra dose of Allegra on the days your allergy symptoms are worse.  Please let know if this treatment plan does not work well for you.  Schedule follow up appointment in 6 months or earlier if needed.     Thank you for the opportunity to care for this patient.  Please do not hesitate to contact me with questions.  [place], FNP [facility]    I performed a history and physical examination of the patient and discussed her management with the Nurse Practitioner. I reviewed the Nurse Practitioner's note and agree with the documented findings and plan of care. The note in its entirety was edited by myself, including the physical exam, assessment, and plan. We are going to continue at the same  schedule for now. We are going to continue to freeze her injections at 0.15 mL weekly for now. We could consider resuming her advance in the winter once the exogenous allergen levels decrease. We will continue with weekly injections for now and space out to every two weeks once she gets her new vials. I did explain to Ms. Tibbs that she will not be paying for an entire set of vials every 6 months, instead only paying for a Red Vial. Furthermore, at her maintenance of 0.15 mL, her vials are going to last longer and therefore she will not require new vials quite as often. She does feel better overall and she has not needed any antibiotics for bronchitis  or other infections while on the allergen immunotherapy.   Malachi Bonds, MD [facility]

## 2020-05-20 ENCOUNTER — Ambulatory Visit (INDEPENDENT_AMBULATORY_CARE_PROVIDER_SITE_OTHER): Payer: BC Managed Care – PPO

## 2020-05-20 DIAGNOSIS — J309 Allergic rhinitis, unspecified: Secondary | ICD-10-CM | POA: Diagnosis not present

## 2020-06-01 ENCOUNTER — Ambulatory Visit (INDEPENDENT_AMBULATORY_CARE_PROVIDER_SITE_OTHER): Payer: BC Managed Care – PPO

## 2020-06-01 DIAGNOSIS — J309 Allergic rhinitis, unspecified: Secondary | ICD-10-CM

## 2020-06-08 ENCOUNTER — Ambulatory Visit (INDEPENDENT_AMBULATORY_CARE_PROVIDER_SITE_OTHER): Payer: BC Managed Care – PPO

## 2020-06-08 DIAGNOSIS — J309 Allergic rhinitis, unspecified: Secondary | ICD-10-CM

## 2020-06-13 ENCOUNTER — Other Ambulatory Visit (INDEPENDENT_AMBULATORY_CARE_PROVIDER_SITE_OTHER): Payer: Self-pay | Admitting: Internal Medicine

## 2020-06-15 DIAGNOSIS — M25511 Pain in right shoulder: Secondary | ICD-10-CM | POA: Diagnosis not present

## 2020-06-17 ENCOUNTER — Ambulatory Visit (INDEPENDENT_AMBULATORY_CARE_PROVIDER_SITE_OTHER): Payer: BC Managed Care – PPO

## 2020-06-17 DIAGNOSIS — J309 Allergic rhinitis, unspecified: Secondary | ICD-10-CM

## 2020-06-17 NOTE — Progress Notes (Signed)
Patient called back to report a 5+ local reaction on her right arm along with numbness of that area. She says it is just that area. I confirmed she does not have loss of movement or sensation in her hand. I spoke with Dr. Dellis Anes and he recommended that she use a topical cream/ointment like triamcinolone or hydrocortisone. I informed patient of this. I also let her know that she could use an ice pack as well as take an extra dose of her antihistamine if she felt she needed it. Patient verbalized understanding and will call back with any further concerns.

## 2020-06-24 ENCOUNTER — Ambulatory Visit (INDEPENDENT_AMBULATORY_CARE_PROVIDER_SITE_OTHER): Payer: BC Managed Care – PPO

## 2020-06-24 DIAGNOSIS — J309 Allergic rhinitis, unspecified: Secondary | ICD-10-CM

## 2020-06-27 DIAGNOSIS — J301 Allergic rhinitis due to pollen: Secondary | ICD-10-CM | POA: Diagnosis not present

## 2020-06-27 NOTE — Progress Notes (Signed)
VIALS EXP 06-27-21 °

## 2020-06-29 ENCOUNTER — Ambulatory Visit (INDEPENDENT_AMBULATORY_CARE_PROVIDER_SITE_OTHER): Payer: BC Managed Care – PPO

## 2020-06-29 DIAGNOSIS — J309 Allergic rhinitis, unspecified: Secondary | ICD-10-CM

## 2020-07-06 ENCOUNTER — Ambulatory Visit (INDEPENDENT_AMBULATORY_CARE_PROVIDER_SITE_OTHER): Payer: BC Managed Care – PPO

## 2020-07-06 DIAGNOSIS — J309 Allergic rhinitis, unspecified: Secondary | ICD-10-CM | POA: Diagnosis not present

## 2020-07-13 ENCOUNTER — Other Ambulatory Visit (INDEPENDENT_AMBULATORY_CARE_PROVIDER_SITE_OTHER): Payer: Self-pay | Admitting: Internal Medicine

## 2020-07-15 ENCOUNTER — Ambulatory Visit (INDEPENDENT_AMBULATORY_CARE_PROVIDER_SITE_OTHER): Payer: BC Managed Care – PPO

## 2020-07-15 DIAGNOSIS — J309 Allergic rhinitis, unspecified: Secondary | ICD-10-CM

## 2020-07-20 ENCOUNTER — Ambulatory Visit (INDEPENDENT_AMBULATORY_CARE_PROVIDER_SITE_OTHER): Payer: BC Managed Care – PPO

## 2020-07-20 DIAGNOSIS — J309 Allergic rhinitis, unspecified: Secondary | ICD-10-CM | POA: Diagnosis not present

## 2020-07-25 ENCOUNTER — Other Ambulatory Visit (INDEPENDENT_AMBULATORY_CARE_PROVIDER_SITE_OTHER): Payer: Self-pay | Admitting: Internal Medicine

## 2020-07-29 ENCOUNTER — Ambulatory Visit (INDEPENDENT_AMBULATORY_CARE_PROVIDER_SITE_OTHER): Payer: BC Managed Care – PPO

## 2020-07-29 DIAGNOSIS — J309 Allergic rhinitis, unspecified: Secondary | ICD-10-CM

## 2020-08-09 ENCOUNTER — Encounter (INDEPENDENT_AMBULATORY_CARE_PROVIDER_SITE_OTHER): Payer: BC Managed Care – PPO | Admitting: Internal Medicine

## 2020-08-12 ENCOUNTER — Ambulatory Visit (INDEPENDENT_AMBULATORY_CARE_PROVIDER_SITE_OTHER): Payer: BC Managed Care – PPO

## 2020-08-12 DIAGNOSIS — J309 Allergic rhinitis, unspecified: Secondary | ICD-10-CM

## 2020-08-26 ENCOUNTER — Ambulatory Visit (INDEPENDENT_AMBULATORY_CARE_PROVIDER_SITE_OTHER): Payer: BC Managed Care – PPO

## 2020-08-26 DIAGNOSIS — J309 Allergic rhinitis, unspecified: Secondary | ICD-10-CM

## 2020-08-29 ENCOUNTER — Other Ambulatory Visit (INDEPENDENT_AMBULATORY_CARE_PROVIDER_SITE_OTHER): Payer: Self-pay | Admitting: Internal Medicine

## 2020-09-01 ENCOUNTER — Other Ambulatory Visit: Payer: Self-pay

## 2020-09-01 ENCOUNTER — Encounter (INDEPENDENT_AMBULATORY_CARE_PROVIDER_SITE_OTHER): Payer: Self-pay | Admitting: Internal Medicine

## 2020-09-01 ENCOUNTER — Ambulatory Visit (INDEPENDENT_AMBULATORY_CARE_PROVIDER_SITE_OTHER): Payer: BC Managed Care – PPO | Admitting: Internal Medicine

## 2020-09-01 VITALS — BP 114/72 | HR 70 | Temp 97.9°F | Ht 60.0 in | Wt 114.4 lb

## 2020-09-01 DIAGNOSIS — E559 Vitamin D deficiency, unspecified: Secondary | ICD-10-CM | POA: Diagnosis not present

## 2020-09-01 DIAGNOSIS — R1319 Other dysphagia: Secondary | ICD-10-CM | POA: Diagnosis not present

## 2020-09-01 DIAGNOSIS — E2839 Other primary ovarian failure: Secondary | ICD-10-CM

## 2020-09-01 DIAGNOSIS — Z0001 Encounter for general adult medical examination with abnormal findings: Secondary | ICD-10-CM

## 2020-09-01 DIAGNOSIS — M797 Fibromyalgia: Secondary | ICD-10-CM

## 2020-09-01 NOTE — Progress Notes (Signed)
Chief Complaint: This delightful 64 year old lady comes in for an annual physical exam and to address her chronic conditions which are described below. HPI: She has a history of fibromyalgia which is vastly improved for the last 1 year.  She has been on bioidentical hormone therapy for menopausal symptoms and wellness.  She has tolerated testosterone, estradiol and progesterone and doses are fairly optimal except for estradiol levels. She continues on vitamin D3 supplementation for vitamin D deficiency. Today she also is concerned about dysphagia for solid foods that she has had over the last 1 year or so.  This seems to be getting worse.  She notices that she is able to swallow liquids but she has to chew many more times before she is able to swallow solid foods.  Eventually, she is able to swallow but it still seems to be difficult and a feeling of food getting stuck in the upper part of her esophagus.  She denies any nausea or vomiting.  She has not had any hematemesis.  She denies any classical symptoms of acid reflux disease.  Past Medical History:  Diagnosis Date  . Asthma   . Female hypogonadism syndrome   . Fibromyalgia 07/01/2019  . Malaise and fatigue   . Vitamin D deficiency disease    Past Surgical History:  Procedure Laterality Date  . CYSTECTOMY       Social History   Social History Narrative   Married 11 years,third.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    Social History   Tobacco Use  . Smoking status: Never Smoker  . Smokeless tobacco: Never Used  Substance Use Topics  . Alcohol use: Yes    Alcohol/week: 7.0 standard drinks    Types: 7 Glasses of wine per week      Allergies:  Allergies  Allergen Reactions  . Codeine Hives    Itch    . Cefdinir   . Diphenhydramine Hcl Hives     Current Meds  Medication Sig  . albuterol (VENTOLIN HFA) 108 (90 Base) MCG/ACT inhaler Inhale 2 puffs into the lungs every 4 (four) hours as needed for wheezing or shortness  of breath.  . butalbital-acetaminophen-caffeine (FIORICET) 50-325-40 MG tablet TAKE 1 TABLET BY MOUTH ONCE DAILY AS NEEDED  . Cholecalciferol (VITAMIN D3) 250 MCG (10000 UT) TABS Take 4,000 Units by mouth.   . clonazePAM (KLONOPIN) 0.5 MG tablet   . EPINEPHrine (AUVI-Q) 0.3 mg/0.3 mL IJ SOAJ injection Inject 0.3 mLs (0.3 mg total) into the muscle as needed for anaphylaxis.  Marland Kitchen estradiol (ESTRACE) 1 MG tablet TAKE 1 & 1/2 (ONE & ONE-HALF) TABLETS BY MOUTH ONCE DAILY  . fexofenadine (ALLEGRA) 180 MG tablet Take 180 mg by mouth daily.  Marland Kitchen ibuprofen (ADVIL,MOTRIN) 800 MG tablet Take 1 tablet (800 mg total) by mouth 3 (three) times daily.  . Misc Natural Products (NF FORMULAS TESTOSTERONE) CAPS Take 15 mg by mouth daily at 12 noon. Compounded  . NP THYROID 60 MG tablet Take 1 tablet by mouth twice daily  . progesterone (PROMETRIUM) 100 MG capsule Take 200 mg by mouth daily.  Marland Kitchen thyroid (NP THYROID) 30 MG tablet Take 30 mg by mouth daily before breakfast.  . tiZANidine (ZANAFLEX) 2 MG tablet Take 2 mg by mouth 3 (three) times daily as needed.  . triamcinolone (NASACORT) 55 MCG/ACT AERO nasal inhaler Place 1 spray into the nose daily.  . [DISCONTINUED] progesterone (PROMETRIUM) 100 MG capsule TAKE 3 CAPSULES BY MOUTH ONCE DAILY AT NIGHT (Patient taking differently: Take  200 mg by mouth at bedtime. )       Depression screen [facility] 2/9 09/01/2020 02/08/2020  Decreased Interest 0 0  Down, Depressed, Hopeless 0 0  PHQ - 2 Score 0 0  Altered sleeping 0 -  Tired, decreased energy 0 -  Change in appetite 0 -  Feeling bad or failure about yourself  0 -  Trouble concentrating 0 -  Moving slowly or fidgety/restless 0 -  Suicidal thoughts 0 -  PHQ-9 Score 0 -  Difficult doing work/chores Not difficult at all -     AST:MHDQQ from the symptoms mentioned above,there are no other symptoms referable to all systems reviewed.       Physical Exam: Blood pressure 114/72, pulse 70, temperature 97.9 F (36.6  C), temperature source Temporal, height 5' (1.524 m), weight 114 lb 6.4 oz (51.9 kg), SpO2 98 %. Vitals with BMI 09/01/2020 05/11/2020 02/08/2020  Height 5\' 0"  - 5\' 1"   Weight 114 lbs 6 oz 115 lbs 113 lbs 10 oz  BMI 22.34 - 21.48  Systolic 114 124  Diastolic 72 50 55  Pulse 70 92 69      She looks systemically well.  Healthy weight. General: Alert, cooperative, and appears to be the stated age.No pallor.  No jaundice.  No clubbing. Head: Normocephalic Eyes: Sclera white, pupils equal and reactive to light, red reflex x 2,  Ears: Normal bilaterally Oral cavity: Lips, mucosa, and tongue normal: Teeth and gums normal Neck: No adenopathy, supple, symmetrical, trachea midline, and thyroid does not appear enlarged Respiratory: Clear to auscultation bilaterally.No wheezing, crackles or bronchial breathing. Cardiovascular: Heart sounds are present and appear to be normal without murmurs or added sounds.  No carotid bruits.  Peripheral pulses are present and equal bilaterally.: Gastrointestinal:positive bowel sounds, no hepatosplenomegaly.  No masses felt.No tenderness. Skin: Clear, No rashes noted.No worrisome skin lesions seen. Neurological: Grossly intact without focal findings, cranial nerves II through XII intact, muscle strength equal bilaterally Musculoskeletal: No acute joint abnormalities noted.Full range of movement noted with joints. Psychiatric: Affect appropriate, non-anxious.    Assessment  1. Encounter for general adult medical examination with abnormal findings   2. Esophageal dysphagia   3. Female hypogonadism syndrome   4. Vitamin D deficiency disease   5. Fibromyalgia     Tests Ordered:   Orders Placed This Encounter  Procedures  . Progesterone  . Estradiol  . Ambulatory referral to Gastroenterology     Plan  1. Overall is healthy 64 year old lady. 2. I will refer to Dr. [id number], gastroenterology for her esophageal dysphagia.  I do not think this is  straightforward gastroesophageal reflux disease. 3. We will try to further optimize her estradiol so she will start taking estradiol 1.5 mg alternating with estradiol 2 mg and see if she can tolerate this.  Eventually, I would like her to get to estradiol 2 mg daily.  I explained the cardioprotective, brain, bone protection effects of optimizing estradiol. 4. She will continue with vitamin D3 supplementation for vitamin D deficiency. 5. She will continue with desiccated NP thyroid, which is being used off label, for symptoms of thyroid hypofunction. 6. In approximately 4 to 6 weeks time, she will get blood work done for estradiol and progesterone levels. 7. Follow-up in 6 months with me. 8. Today, in addition to a preventative visit, I performed an office visit to address her chronic conditions and symptoms above.     No orders of the defined types were placed in this  encounter.    Nobel Brar C Cressie Betzler   09/01/2020, 11:17 AM

## 2020-09-05 ENCOUNTER — Encounter (INDEPENDENT_AMBULATORY_CARE_PROVIDER_SITE_OTHER): Payer: Self-pay | Admitting: Gastroenterology

## 2020-09-14 ENCOUNTER — Other Ambulatory Visit (INDEPENDENT_AMBULATORY_CARE_PROVIDER_SITE_OTHER): Payer: Self-pay | Admitting: Nurse Practitioner

## 2020-09-16 ENCOUNTER — Ambulatory Visit (INDEPENDENT_AMBULATORY_CARE_PROVIDER_SITE_OTHER): Payer: BC Managed Care – PPO

## 2020-09-16 DIAGNOSIS — J309 Allergic rhinitis, unspecified: Secondary | ICD-10-CM

## 2020-09-22 ENCOUNTER — Other Ambulatory Visit: Payer: Self-pay | Admitting: Obstetrics and Gynecology

## 2020-09-22 DIAGNOSIS — N6320 Unspecified lump in the left breast, unspecified quadrant: Secondary | ICD-10-CM

## 2020-09-23 ENCOUNTER — Ambulatory Visit (INDEPENDENT_AMBULATORY_CARE_PROVIDER_SITE_OTHER): Payer: BC Managed Care – PPO

## 2020-09-23 DIAGNOSIS — J309 Allergic rhinitis, unspecified: Secondary | ICD-10-CM

## 2020-09-24 ENCOUNTER — Other Ambulatory Visit (INDEPENDENT_AMBULATORY_CARE_PROVIDER_SITE_OTHER): Payer: Self-pay | Admitting: Internal Medicine

## 2020-09-26 ENCOUNTER — Other Ambulatory Visit (INDEPENDENT_AMBULATORY_CARE_PROVIDER_SITE_OTHER): Payer: Self-pay

## 2020-09-26 MED ORDER — NP THYROID 30 MG PO TABS: 30.0000 mg | ORAL_TABLET | Freq: Every evening | ORAL | 3 refills | Status: DC

## 2020-09-30 ENCOUNTER — Ambulatory Visit (INDEPENDENT_AMBULATORY_CARE_PROVIDER_SITE_OTHER): Payer: BC Managed Care – PPO

## 2020-09-30 DIAGNOSIS — J309 Allergic rhinitis, unspecified: Secondary | ICD-10-CM

## 2020-10-09 ENCOUNTER — Emergency Department (HOSPITAL_COMMUNITY): Payer: BC Managed Care – PPO

## 2020-10-09 ENCOUNTER — Other Ambulatory Visit: Payer: Self-pay

## 2020-10-09 ENCOUNTER — Emergency Department (HOSPITAL_COMMUNITY)
Admission: EM | Admit: 2020-10-09 | Discharge: 2020-10-09 | Disposition: A | Discharge status: Home / Self Care | Payer: BC Managed Care – PPO | Attending: Emergency Medicine | Admitting: Emergency Medicine

## 2020-10-09 ENCOUNTER — Encounter (HOSPITAL_COMMUNITY): Payer: Self-pay

## 2020-10-09 DIAGNOSIS — N390 Urinary tract infection, site not specified: Secondary | ICD-10-CM

## 2020-10-09 DIAGNOSIS — J4540 Moderate persistent asthma, uncomplicated: Secondary | ICD-10-CM | POA: Diagnosis not present

## 2020-10-09 DIAGNOSIS — Z20822 Contact with and (suspected) exposure to covid-19: Secondary | ICD-10-CM | POA: Diagnosis not present

## 2020-10-09 DIAGNOSIS — R9431 Abnormal electrocardiogram [ECG] [EKG]: Secondary | ICD-10-CM | POA: Diagnosis not present

## 2020-10-09 DIAGNOSIS — N8500 Endometrial hyperplasia, unspecified: Secondary | ICD-10-CM | POA: Insufficient documentation

## 2020-10-09 DIAGNOSIS — R059 Cough, unspecified: Secondary | ICD-10-CM | POA: Diagnosis not present

## 2020-10-09 DIAGNOSIS — D259 Leiomyoma of uterus, unspecified: Secondary | ICD-10-CM | POA: Diagnosis not present

## 2020-10-09 DIAGNOSIS — D7389 Other diseases of spleen: Secondary | ICD-10-CM | POA: Diagnosis not present

## 2020-10-09 DIAGNOSIS — K7689 Other specified diseases of liver: Secondary | ICD-10-CM | POA: Diagnosis not present

## 2020-10-09 DIAGNOSIS — R1031 Right lower quadrant pain: Secondary | ICD-10-CM | POA: Diagnosis not present

## 2020-10-09 LAB — URINALYSIS, ROUTINE W REFLEX MICROSCOPIC
Bacteria, UA: NONE SEEN
Bilirubin Urine: NEGATIVE
Glucose, UA: NEGATIVE mg/dL
Hgb urine dipstick: NEGATIVE
Ketones, ur: NEGATIVE mg/dL
Leukocytes,Ua: NEGATIVE
Nitrite: POSITIVE — AB
Protein, ur: NEGATIVE mg/dL
Specific Gravity, Urine: 1.003 — ABNORMAL LOW (ref 1.005–1.030)
pH: 6 (ref 5.0–8.0)

## 2020-10-09 LAB — CBC
HCT: 37.1 % (ref 36.0–46.0)
Hemoglobin: 11.9 g/dL — ABNORMAL LOW (ref 12.0–15.0)
MCH: 30.9 pg (ref 26.0–34.0)
MCHC: 32.1 g/dL (ref 30.0–36.0)
MCV: 96.4 fL (ref 80.0–100.0)
Platelets: 281 10*3/uL (ref 150–400)
RBC: 3.85 MIL/uL — ABNORMAL LOW (ref 3.87–5.11)
RDW: 12.4 % (ref 11.5–15.5)
WBC: 7.1 10*3/uL (ref 4.0–10.5)
nRBC: 0 % (ref 0.0–0.2)

## 2020-10-09 LAB — COMPREHENSIVE METABOLIC PANEL
ALT: 13 U/L (ref 0–44)
AST: 20 U/L (ref 15–41)
Albumin: 3.7 g/dL (ref 3.5–5.0)
Alkaline Phosphatase: 48 U/L (ref 38–126)
Anion gap: 9 (ref 5–15)
BUN: 9 mg/dL (ref 8–23)
CO2: 26 mmol/L (ref 22–32)
Calcium: 8.7 mg/dL — ABNORMAL LOW (ref 8.9–10.3)
Chloride: 99 mmol/L (ref 98–111)
Creatinine, Ser: 0.79 mg/dL (ref 0.44–1.00)
GFR, Estimated: >60 mL/min (ref >60)
Glucose, Bld: 115 mg/dL — ABNORMAL HIGH (ref 70–99)
Potassium: 4.2 mmol/L (ref 3.5–5.1)
Sodium: 134 mmol/L — ABNORMAL LOW (ref 135–145)
Total Bilirubin: 0.4 mg/dL (ref 0.3–1.2)
Total Protein: 6.8 g/dL (ref 6.5–8.1)

## 2020-10-09 LAB — RESP PANEL BY RT-PCR (FLU A&B, COVID) ARPGX2
Influenza A by PCR: NEGATIVE
Influenza B by PCR: NEGATIVE
SARS Coronavirus 2 by RT PCR: NEGATIVE

## 2020-10-09 LAB — LIPASE, BLOOD: Lipase: 29 U/L (ref 11–51)

## 2020-10-09 MED ORDER — SODIUM CHLORIDE 0.9 % IV BOLUS
1000.0000 mL | Freq: Once | INTRAVENOUS | Status: AC
  Administered 2020-10-09: 1000 mL via INTRAVENOUS

## 2020-10-09 MED ORDER — FENTANYL CITRATE (PF) 100 MCG/2ML IJ SOLN
50.0000 ug | Freq: Once | INTRAMUSCULAR | Status: AC
  Administered 2020-10-09: 50 ug via INTRAVENOUS
  Filled 2020-10-09: qty 2

## 2020-10-09 MED ORDER — NITROFURANTOIN MONOHYD MACRO 100 MG PO CAPS
100.0000 mg | ORAL_CAPSULE | Freq: Two times a day (BID) | ORAL | Status: DC
  Administered 2020-10-09: 100 mg via ORAL
  Filled 2020-10-09: qty 1

## 2020-10-09 MED ORDER — HYDROCODONE-ACETAMINOPHEN 5-325 MG PO TABS: 1.0000 | ORAL_TABLET | ORAL | 0 refills | Status: DC | PRN

## 2020-10-09 MED ORDER — ONDANSETRON HCL 4 MG/2ML IJ SOLN
4.0000 mg | Freq: Once | INTRAMUSCULAR | Status: AC
  Administered 2020-10-09: 4 mg via INTRAVENOUS
  Filled 2020-10-09: qty 2

## 2020-10-09 MED ORDER — IOHEXOL 300 MG/ML  SOLN
100.0000 mL | Freq: Once | INTRAMUSCULAR | Status: AC | PRN
  Administered 2020-10-09: 100 mL via INTRAVENOUS

## 2020-10-09 MED ORDER — NITROFURANTOIN MONOHYD MACRO 100 MG PO CAPS: 100.0000 mg | ORAL_CAPSULE | Freq: Two times a day (BID) | ORAL | 0 refills | Status: DC

## 2020-10-09 NOTE — ED Triage Notes (Signed)
Pt to er, pt states that about 10 days ago she started having some lower abd pain and back pain, states that she thought that it was a uti and took some AZO, then took an old abx that she had at home, augmentin, states that she has been really busy caring for her mother, states that she is here because she is having the abd pain/ back pain.  States that yesterday she started having a cough and felt like it was her seasonal allergies, states that today her pain is worse and she feels poorly

## 2020-10-09 NOTE — ED Provider Notes (Signed)
[facility] EMERGENCY DEPARTMENT Provider Note   CSN: [id number] Arrival date & time: 10/09/20  1440    History Chief Complaint  Patient presents with  . Abdominal Pain  . Back Pain    Sabrina Clark is a 64 y.o. female with past medical history significant for asthma, fibromyalgia who presents for evaluation of abdominal pain.  Patient states approximately 10 days ago she started having some suprapubic pain.  This goes into her back.  She initially thought this was a UTI.  Took Azo which help with the pain.  She also took an old prescription of Augmentin.  Initially this helped however the symptoms returned.  She has no pain to her flank.  She feels like she has a constant sensation of a full bladder however denies any dysuria, hematuria.  Has had UTIs in the past or states this does not feel exactly the same.  Denies any concerns for any STDs, vaginal discharge.  States yesterday she developed a cough.  She used her albuterol inhaler which states helped with her cough.  She denies any fever, chills, nausea, vomiting, chest pain, shortness of breath, diarrhea or constipation.  Denies additional aggravating or alleviating factors rates her current pain a 6/10 for does not anything for pain at this time  History obtained from patient and past medical records.  No interpreter used  HPI     Past Medical History:  Diagnosis Date  . Asthma   . Female hypogonadism syndrome   . Fibromyalgia 07/01/2019  . Malaise and fatigue   . Vitamin D deficiency disease     Patient Active Problem List   Diagnosis Date Noted  . Fibromyalgia 07/01/2019  . Vitamin D deficiency disease   . Female hypogonadism syndrome   . Asthma   . Malaise and fatigue   . Seasonal and perennial allergic rhinitis 12/19/2018  . Moderate persistent asthma, uncomplicated 12/19/2018    Past Surgical History:  Procedure Laterality Date  . CYSTECTOMY       OB History   No obstetric history on file.     Family  History  Problem Relation Age of Onset  . Graves' disease Mother   . Fibromyalgia Mother   . Allergic rhinitis Father   . Cancer Father   . Diabetes Father   . Breast cancer Neg Hx   . Angioedema Neg Hx   . Asthma Neg Hx   . Atopy Neg Hx   . Eczema Neg Hx   . Immunodeficiency Neg Hx   . Urticaria Neg Hx     Social History   Tobacco Use  . Smoking status: Never Smoker  . Smokeless tobacco: Never Used  Vaping Use  . Vaping Use: Never used  Substance Use Topics  . Alcohol use: Yes    Alcohol/week: 7.0 standard drinks    Types: 7 Glasses of wine per week  . Drug use: No    Home Medications Prior to Admission medications   Medication Sig Start Date End Date Taking? Authorizing Provider  albuterol (VENTOLIN HFA) 108 (90 Base) MCG/ACT inhaler Inhale 2 puffs into the lungs every 4 (four) hours as needed for wheezing or shortness of breath. 05/11/20  Yes Nehemiah Settle, FNP  butalbital-acetaminophen-caffeine (FIORICET) 50-325-40 MG tablet TAKE 1 TABLET BY MOUTH ONCE DAILY AS NEEDED 06/13/20  Yes Gosrani, Nimish C, MD  Cholecalciferol (VITAMIN D3) 250 MCG (10000 UT) TABS Take 4,000 Units by mouth.    Yes [provider]  clonazePAM (KLONOPIN) 0.5 MG tablet  Take 0.5 mg by mouth daily as needed. 12/09/18  Yes [provider]  EPINEPHrine (AUVI-Q) 0.3 mg/0.3 mL IJ SOAJ injection Inject 0.3 mLs (0.3 mg total) into the muscle as needed for anaphylaxis. 02/05/20  Yes Alfonse Spruce, MD  estradiol (ESTRACE) 1 MG tablet TAKE 1 & 1/2 (ONE & ONE-HALF) TABLETS BY MOUTH ONCE DAILY Patient taking differently: Take 2 mg by mouth daily. 08/29/20  Yes Gosrani, Nimish C, MD  fexofenadine (ALLEGRA) 180 MG tablet Take 180 mg by mouth daily.   Yes [provider]  Misc Natural Products (NF FORMULAS TESTOSTERONE) CAPS Take 15 mg by mouth daily at 12 noon. Compounded 04/04/20  Yes Gosrani, Nimish C, MD  NP THYROID 30 MG tablet Take 1 tablet (30 mg total) by mouth every  evening. 09/26/20 10/26/20 Yes Wilson Singer, MD  NP THYROID 60 MG tablet Take 1 tablet by mouth twice daily Patient taking differently: Take 60 mg by mouth daily before breakfast. 09/14/20  Yes Gosrani, Nimish C, MD  progesterone (PROMETRIUM) 100 MG capsule Take 200 mg by mouth daily.   Yes [provider]  tiZANidine (ZANAFLEX) 2 MG tablet Take 2 mg by mouth every evening. 06/15/20  Yes [provider]  triamcinolone (NASACORT) 55 MCG/ACT AERO nasal inhaler Place 1 spray into the nose daily. 12/19/18  Yes Alfonse Spruce, MD  HYDROcodone-acetaminophen (NORCO/VICODIN) 5-325 MG tablet Take 1 tablet by mouth every 4 (four) hours as needed. 10/09/20   Dorthia Tout A, PA-C  ibuprofen (ADVIL,MOTRIN) 800 MG tablet Take 1 tablet (800 mg total) by mouth 3 (three) times daily. Patient not taking: Reported on 10/09/2020 01/15/16   Eber Hong, MD  nitrofurantoin, macrocrystal-monohydrate, (MACROBID) 100 MG capsule Take 1 capsule (100 mg total) by mouth 2 (two) times daily. 10/09/20   Jennett Tarbell A, PA-C    Allergies    Codeine, Cefdinir, and Diphenhydramine hcl  Review of Systems   Review of Systems  Constitutional: Negative.   HENT: Negative.   Respiratory: Positive for cough. Negative for apnea, choking, chest tightness, shortness of breath, wheezing and stridor.   Cardiovascular: Negative.   Gastrointestinal: Positive for abdominal pain. Negative for abdominal distention, anal bleeding, blood in stool, constipation, diarrhea, nausea, rectal pain and vomiting.  Genitourinary: Positive for dysuria. Negative for decreased urine volume, difficulty urinating, dyspareunia, enuresis, flank pain, frequency, genital sores, hematuria, menstrual problem, pelvic pain, urgency, vaginal bleeding, vaginal discharge and vaginal pain.  Musculoskeletal: Negative.   Skin: Negative.   Neurological: Negative.   All other systems reviewed and are negative.  Physical Exam Updated Vital  Signs BP 124/72   Pulse 76   Temp 98.5 F (36.9 C) (Oral)   Resp 19   Ht 5' (1.524 m)   Wt 50.8 kg   SpO2 98%   BMI 21.87 kg/m   Physical Exam Vitals and nursing note reviewed.  Constitutional:      General: She is not in acute distress.    Appearance: She is well-developed and well-nourished. She is not ill-appearing, toxic-appearing or diaphoretic.  HENT:     Head: Normocephalic and atraumatic.     Jaw: There is normal jaw occlusion.     Mouth/Throat:     Mouth: Mucous membranes are moist.  Eyes:     Pupils: Pupils are equal, round, and reactive to light.  Cardiovascular:     Rate and Rhythm: Normal rate.     Pulses: Normal pulses and intact distal pulses.  Radial pulses are 2+ on the right side and 2+ on the left side.       Dorsalis pedis pulses are 2+ on the right side and 2+ on the left side.       Posterior tibial pulses are 2+ on the right side and 2+ on the left side.     Heart sounds: Normal heart sounds.  Pulmonary:     Effort: Pulmonary effort is normal. No respiratory distress.     Breath sounds: Normal breath sounds.     Comments: Speaks in full sentences without difficulty.  No wheeze, rhonchi or rales.  Clear to auscultation. Abdominal:     General: There is no distension.     Palpations: Abdomen is soft.     Tenderness: There is abdominal tenderness in the right lower quadrant, suprapubic area and left lower quadrant. There is no right CVA tenderness, left CVA tenderness, guarding or rebound. Negative signs include Murphy's sign and McBurney's sign.     Hernia: No hernia is present.  Genitourinary:    Comments: Declined Musculoskeletal:        General: Normal range of motion.     Cervical back: Normal range of motion.  Skin:    General: Skin is warm and dry.     Capillary Refill: Capillary refill takes less than 2 seconds.  Neurological:     General: No focal deficit present.     Mental Status: She is alert and oriented to person, place, and  time.  Psychiatric:        Mood and Affect: Mood and affect normal.    ED Results / Procedures / Treatments   Labs (all labs ordered are listed, but only abnormal results are displayed) Labs Reviewed  COMPREHENSIVE METABOLIC PANEL - Abnormal; Notable for the following components:      Result Value   Sodium 134 (*)    Glucose, Bld 115 (*)    Calcium 8.7 (*)    All other components within normal limits  CBC - Abnormal; Notable for the following components:   RBC 3.85 (*)    Hemoglobin 11.9 (*)    All other components within normal limits  URINALYSIS, ROUTINE W REFLEX MICROSCOPIC - Abnormal; Notable for the following components:   Color, Urine AMBER (*)    Specific Gravity, Urine 1.003 (*)    Nitrite POSITIVE (*)    All other components within normal limits  RESP PANEL BY RT-PCR (FLU A&B, COVID) ARPGX2  URINE CULTURE  LIPASE, BLOOD    EKG None  Radiology DG Chest 2 View  Result Date: 10/09/2020 CLINICAL DATA:  Cough since yesterday with lower abdominal and back pain. EXAM: CHEST - 2 VIEW COMPARISON:  11/24/2018 FINDINGS: Lungs are adequately inflated without airspace consolidation or effusion. Cardiomediastinal silhouette and remainder of the exam is unchanged. IMPRESSION: No active cardiopulmonary disease. Electronically Signed   By: Elberta Fortis M.D.   On: 10/09/2020 18:18   CT Abdomen Pelvis W Contrast  Result Date: 10/09/2020 CLINICAL DATA:  64 year old female with abdomen pain. EXAM: CT ABDOMEN AND PELVIS WITH CONTRAST TECHNIQUE: Multidetector CT imaging of the abdomen and pelvis was performed using the standard protocol following bolus administration of intravenous contrast. CONTRAST:  OMNIPAQUE IOHEXOL 300 MG/ML  SOLN COMPARISON:  None. FINDINGS: Lower chest: The visualized lung bases are clear. No intra-abdominal free air or free fluid. Hepatobiliary: There is a 12 mm right hepatic cyst. Several additional subcentimeter hepatic hypodense lesions are too small to  characterize. No  intrahepatic biliary duct dilatation. The gallbladder is unremarkable. Pancreas: Unremarkable. No pancreatic ductal dilatation or surrounding inflammatory changes. Spleen: Small calcified splenic granuloma. Adrenals/Urinary Tract: The adrenal glands unremarkable. There is no hydronephrosis on either side. There is symmetric enhancement and excretion of contrast by both kidneys. The visualized ureters and urinary bladder appear unremarkable. Stomach/Bowel: There is moderate stool throughout the colon. There is no bowel obstruction or active inflammation. The appendix is normal. Vascular/Lymphatic: The abdominal aorta and IVC are unremarkable. No portal venous gas. There is no adenopathy. Reproductive: The uterus is anteverted. The endometrium is thickened measuring 17 mm. Findings concerning for endometrial hyperplasia, polyp, or neoplasm versus possible cervical stricture and dilatation of the endometrium. Further evaluation with hysteroscopy is recommended. Multiple small uterine fibroids noted. No adnexal masses. Other: None Musculoskeletal: No acute or significant osseous findings. IMPRESSION: 1. Thickened endometrium. Further evaluation with hysteroscopy is recommended. 2. No bowel obstruction. Normal appendix. Electronically Signed   By: Elgie Collard M.D.   On: 10/09/2020 18:20    Procedures Procedures (including critical care time)  Medications Ordered in ED Medications  nitrofurantoin (macrocrystal-monohydrate) (MACROBID) capsule 100 mg (has no administration in time range)  ondansetron (ZOFRAN) injection 4 mg (4 mg Intravenous Given 10/09/20 1738)  fentaNYL (SUBLIMAZE) injection 50 mcg (50 mcg Intravenous Given 10/09/20 1737)  sodium chloride 0.9 % bolus 1,000 mL (1,000 mLs Intravenous New Bag/Given (Non-Interop) 10/09/20 1904)  iohexol (OMNIPAQUE) 300 MG/ML solution 100 mL (100 mLs Intravenous Contrast Given 10/09/20 1757)   ED Course  I have reviewed the triage vital  signs and the nursing notes.  Pertinent labs & imaging results that were available during my care of the patient were reviewed by me and considered in my medical decision making (see chart for details).  64 year old presents for evaluation of suprapubic pressure.  She is afebrile, nonseptic, non-ill-appearing.  Will occasionally radiate into her back.  He is afebrile, nonseptic, non-ill-appearing.  Did develop a cough last night, used her albuterol inhaler which helped.  She has no chest pain, shortness of breath.  She has no tachycardia, tachypnea or hypoxia.  Her heart and lungs are clear.  Her abdomen does have some tenderness diffusely to her lower abdomen, worse in her suprapubic region.  She denies any dysuria, hematuria however does feel like she constantly has a "full bladder."  She does feel like she is able to fully empty her bladder when she uses the restroom.  She has no diarrhea or constipation.  She is neurovascularly intact.  Has equal and intact distal pulses.  We will plan on labs, imaging and reassess  Labs, imaging personally reviewed and interpreted:  CBC without leukocytosis Metabolic panel with sodium 134, glucose 115, calcium 8.7, noticed electrolyte, renal or liver abnormality Lipase 29 COVID, Flu negative UA positive nitrate, negative leuks, no bacteria UA sent for culture CT AP with endometrial hyperplasia, recommend outpatient hysteroscopy  Reassessed.  Pain improved.  Given urine does have nitrites and she is symptomatic we will provide antibiotics for possible UTI.  We will plan on following up on culture.  Had thorough discussion with patient and husband in room on outpatient OB/GYN follow-up given moderate endometrial hyperplasia on her CT scan.  She is agreeable for this.  Patient is nontoxic, nonseptic appearing, in no apparent distress.  Patient's pain and other symptoms adequately managed in emergency department.  Fluid bolus given.  Labs, imaging and vitals  reviewed.  Patient does not meet the SIRS or Sepsis criteria.  On repeat  exam patient does not have a surgical abdomin and there are no peritoneal signs.  No indication of appendicitis, bowel obstruction, bowel perforation, cholecystitis, diverticulitis, PID, TOA, Torsion, or ectopic pregnancy, AAA, dissection.   The patient has been appropriately medically screened and/or stabilized in the ED. I have low suspicion for any other emergent medical condition which would require further screening, evaluation or treatment in the ED or require inpatient management.  Patient is hemodynamically stable and in no acute distress.  Patient able to ambulate in department prior to ED.  Evaluation does not show acute pathology that would require ongoing or additional emergent interventions while in the emergency department or further inpatient treatment.  I have discussed the diagnosis with the patient and answered all questions.  Pain is been managed while in the emergency department and patient has no further complaints prior to discharge.  Patient is comfortable with plan discussed in room and is stable for discharge at this time.  I have discussed strict return precautions for returning to the emergency department.  Patient was encouraged to follow-up with PCP/specialist refer to at discharge.     MDM Rules/Calculators/A&P                           Final Clinical Impression(s) / ED Diagnoses Final diagnoses:  Lower urinary tract infectious disease  Endometrial hyperplasia    Rx / DC Orders ED Discharge Orders         Ordered    nitrofurantoin, macrocrystal-monohydrate, (MACROBID) 100 MG capsule  2 times daily        10/09/20 1931    HYDROcodone-acetaminophen (NORCO/VICODIN) 5-325 MG tablet  Every 4 hours PRN        10/09/20 1931           Nareg Breighner A, PA-C 10/09/20 1934    Sabas SousBero, Michael M, MD 10/09/20 2251

## 2020-10-09 NOTE — Discharge Instructions (Signed)
I have given you antibiotics for a possible UTI.  As we discussed in the room.  Your CT scan does show thickening of your endometrium and you need an outpatient biopsy with your OB/GYN.  Call them this week to schedule this appointment.  Return for any worsening symptoms.

## 2020-10-10 MED FILL — Hydrocodone-Acetaminophen Tab 5-325 MG: ORAL | Qty: 6 | Status: AC

## 2020-10-11 LAB — URINE CULTURE: Culture: NO GROWTH

## 2020-10-18 DIAGNOSIS — N950 Postmenopausal bleeding: Secondary | ICD-10-CM | POA: Diagnosis not present

## 2020-10-18 DIAGNOSIS — R102 Pelvic and perineal pain: Secondary | ICD-10-CM | POA: Diagnosis not present

## 2020-10-19 ENCOUNTER — Telehealth (INDEPENDENT_AMBULATORY_CARE_PROVIDER_SITE_OTHER): Payer: Self-pay

## 2020-10-19 ENCOUNTER — Other Ambulatory Visit (INDEPENDENT_AMBULATORY_CARE_PROVIDER_SITE_OTHER): Payer: Self-pay | Admitting: Internal Medicine

## 2020-10-19 ENCOUNTER — Ambulatory Visit (INDEPENDENT_AMBULATORY_CARE_PROVIDER_SITE_OTHER): Payer: BC Managed Care – PPO

## 2020-10-19 DIAGNOSIS — J309 Allergic rhinitis, unspecified: Secondary | ICD-10-CM

## 2020-10-19 MED ORDER — ESTRADIOL 2 MG PO TABS: 2.0000 mg | ORAL_TABLET | Freq: Every day | ORAL | 3 refills | Status: DC

## 2020-10-19 MED ORDER — TIZANIDINE HCL 2 MG PO TABS: 2.0000 mg | ORAL_TABLET | Freq: Every evening | ORAL | 3 refills | Status: DC

## 2020-10-19 MED ORDER — CLONAZEPAM 0.5 MG PO TABS: 0.5000 mg | ORAL_TABLET | Freq: Every day | ORAL | 1 refills | Status: DC | PRN

## 2020-10-19 NOTE — Telephone Encounter (Signed)
Patient called and needs a new Rx sent to [facility] pharmacy for the following medication:  estradiol (ESTRACE) 1 MG tablet  Last filled for 1 and a half daily and needs to be changed to taking 2 pills daily  Also needs a refill of the following as she takes only as needed:   clonazePAM (KLONOPIN) 0.5 MG tablet Last filled 12/09/2018, Does not show quantity  tiZANidine (ZANAFLEX) 2 MG tablet  Last filled 06/15/2020, Does not show quantity  Last OV 09/01/2020  Next Appointment 03/06/2021

## 2020-10-19 NOTE — Telephone Encounter (Signed)
Okay, please let her know that I have actually changed the estradiol dose since she is tolerating estradiol 1 mg tablet, 2 pills daily.  I have sent a new prescription for estradiol 2 mg tablet, take 1 daily.  I hope this makes sense.

## 2020-10-19 NOTE — Telephone Encounter (Signed)
Called patient and gave her the message. Patient verbalized an understanding and thanked us. 

## 2020-10-25 ENCOUNTER — Other Ambulatory Visit: Payer: Self-pay

## 2020-10-25 ENCOUNTER — Ambulatory Visit
Admission: RE | Admit: 2020-10-25 | Discharge: 2020-10-25 | Disposition: A | Discharge status: Home / Self Care | Payer: BC Managed Care – PPO | Source: Ambulatory Visit | Attending: Obstetrics and Gynecology | Admitting: Obstetrics and Gynecology

## 2020-10-25 ENCOUNTER — Other Ambulatory Visit: Payer: Self-pay | Admitting: Obstetrics and Gynecology

## 2020-10-25 DIAGNOSIS — N6320 Unspecified lump in the left breast, unspecified quadrant: Secondary | ICD-10-CM

## 2020-10-25 DIAGNOSIS — N644 Mastodynia: Secondary | ICD-10-CM | POA: Diagnosis not present

## 2020-10-25 DIAGNOSIS — N6001 Solitary cyst of right breast: Secondary | ICD-10-CM | POA: Diagnosis not present

## 2020-10-25 DIAGNOSIS — R922 Inconclusive mammogram: Secondary | ICD-10-CM | POA: Diagnosis not present

## 2020-10-28 DIAGNOSIS — N950 Postmenopausal bleeding: Secondary | ICD-10-CM | POA: Diagnosis not present

## 2020-11-04 ENCOUNTER — Ambulatory Visit (INDEPENDENT_AMBULATORY_CARE_PROVIDER_SITE_OTHER): Payer: BC Managed Care – PPO

## 2020-11-04 DIAGNOSIS — J309 Allergic rhinitis, unspecified: Secondary | ICD-10-CM | POA: Diagnosis not present

## 2020-11-18 ENCOUNTER — Ambulatory Visit (INDEPENDENT_AMBULATORY_CARE_PROVIDER_SITE_OTHER): Payer: BC Managed Care – PPO

## 2020-11-18 DIAGNOSIS — J309 Allergic rhinitis, unspecified: Secondary | ICD-10-CM

## 2020-11-28 ENCOUNTER — Ambulatory Visit (INDEPENDENT_AMBULATORY_CARE_PROVIDER_SITE_OTHER): Payer: BC Managed Care – PPO | Admitting: Gastroenterology

## 2020-12-01 DIAGNOSIS — N840 Polyp of corpus uteri: Secondary | ICD-10-CM | POA: Diagnosis not present

## 2020-12-01 DIAGNOSIS — N858 Other specified noninflammatory disorders of uterus: Secondary | ICD-10-CM | POA: Diagnosis not present

## 2020-12-01 DIAGNOSIS — N950 Postmenopausal bleeding: Secondary | ICD-10-CM | POA: Diagnosis not present

## 2020-12-16 ENCOUNTER — Ambulatory Visit (INDEPENDENT_AMBULATORY_CARE_PROVIDER_SITE_OTHER): Payer: BC Managed Care – PPO

## 2020-12-16 DIAGNOSIS — J309 Allergic rhinitis, unspecified: Secondary | ICD-10-CM | POA: Diagnosis not present

## 2020-12-19 DIAGNOSIS — Z13820 Encounter for screening for osteoporosis: Secondary | ICD-10-CM | POA: Diagnosis not present

## 2020-12-19 DIAGNOSIS — Z6822 Body mass index (BMI) 22.0-22.9, adult: Secondary | ICD-10-CM | POA: Diagnosis not present

## 2020-12-19 DIAGNOSIS — Z01419 Encounter for gynecological examination (general) (routine) without abnormal findings: Secondary | ICD-10-CM | POA: Diagnosis not present

## 2020-12-30 ENCOUNTER — Ambulatory Visit (INDEPENDENT_AMBULATORY_CARE_PROVIDER_SITE_OTHER): Payer: BC Managed Care – PPO

## 2020-12-30 DIAGNOSIS — J309 Allergic rhinitis, unspecified: Secondary | ICD-10-CM

## 2021-01-18 ENCOUNTER — Ambulatory Visit (INDEPENDENT_AMBULATORY_CARE_PROVIDER_SITE_OTHER): Payer: BC Managed Care – PPO

## 2021-01-18 DIAGNOSIS — J309 Allergic rhinitis, unspecified: Secondary | ICD-10-CM

## 2021-01-25 ENCOUNTER — Other Ambulatory Visit (INDEPENDENT_AMBULATORY_CARE_PROVIDER_SITE_OTHER): Payer: Self-pay | Admitting: Internal Medicine

## 2021-01-25 ENCOUNTER — Ambulatory Visit (INDEPENDENT_AMBULATORY_CARE_PROVIDER_SITE_OTHER): Payer: BC Managed Care – PPO

## 2021-01-25 DIAGNOSIS — J309 Allergic rhinitis, unspecified: Secondary | ICD-10-CM

## 2021-02-01 ENCOUNTER — Ambulatory Visit (INDEPENDENT_AMBULATORY_CARE_PROVIDER_SITE_OTHER): Payer: BC Managed Care – PPO | Admitting: *Deleted

## 2021-02-01 DIAGNOSIS — J309 Allergic rhinitis, unspecified: Secondary | ICD-10-CM

## 2021-02-06 DIAGNOSIS — M9905 Segmental and somatic dysfunction of pelvic region: Secondary | ICD-10-CM | POA: Diagnosis not present

## 2021-02-06 DIAGNOSIS — M461 Sacroiliitis, not elsewhere classified: Secondary | ICD-10-CM | POA: Diagnosis not present

## 2021-02-06 DIAGNOSIS — M9904 Segmental and somatic dysfunction of sacral region: Secondary | ICD-10-CM | POA: Diagnosis not present

## 2021-02-06 DIAGNOSIS — M25551 Pain in right hip: Secondary | ICD-10-CM | POA: Diagnosis not present

## 2021-02-08 DIAGNOSIS — M9905 Segmental and somatic dysfunction of pelvic region: Secondary | ICD-10-CM | POA: Diagnosis not present

## 2021-02-08 DIAGNOSIS — M9904 Segmental and somatic dysfunction of sacral region: Secondary | ICD-10-CM | POA: Diagnosis not present

## 2021-02-08 DIAGNOSIS — M461 Sacroiliitis, not elsewhere classified: Secondary | ICD-10-CM | POA: Diagnosis not present

## 2021-02-08 DIAGNOSIS — M25551 Pain in right hip: Secondary | ICD-10-CM | POA: Diagnosis not present

## 2021-02-10 DIAGNOSIS — M9905 Segmental and somatic dysfunction of pelvic region: Secondary | ICD-10-CM | POA: Diagnosis not present

## 2021-02-10 DIAGNOSIS — M25551 Pain in right hip: Secondary | ICD-10-CM | POA: Diagnosis not present

## 2021-02-10 DIAGNOSIS — M9904 Segmental and somatic dysfunction of sacral region: Secondary | ICD-10-CM | POA: Diagnosis not present

## 2021-02-10 DIAGNOSIS — M461 Sacroiliitis, not elsewhere classified: Secondary | ICD-10-CM | POA: Diagnosis not present

## 2021-02-13 ENCOUNTER — Other Ambulatory Visit (INDEPENDENT_AMBULATORY_CARE_PROVIDER_SITE_OTHER): Payer: Self-pay | Admitting: Internal Medicine

## 2021-02-13 ENCOUNTER — Other Ambulatory Visit (INDEPENDENT_AMBULATORY_CARE_PROVIDER_SITE_OTHER): Payer: Self-pay | Admitting: Nurse Practitioner

## 2021-02-13 DIAGNOSIS — M9904 Segmental and somatic dysfunction of sacral region: Secondary | ICD-10-CM | POA: Diagnosis not present

## 2021-02-13 DIAGNOSIS — M461 Sacroiliitis, not elsewhere classified: Secondary | ICD-10-CM | POA: Diagnosis not present

## 2021-02-13 DIAGNOSIS — M25551 Pain in right hip: Secondary | ICD-10-CM | POA: Diagnosis not present

## 2021-02-13 DIAGNOSIS — M9905 Segmental and somatic dysfunction of pelvic region: Secondary | ICD-10-CM | POA: Diagnosis not present

## 2021-02-15 ENCOUNTER — Ambulatory Visit (INDEPENDENT_AMBULATORY_CARE_PROVIDER_SITE_OTHER): Payer: BC Managed Care – PPO

## 2021-02-15 DIAGNOSIS — J309 Allergic rhinitis, unspecified: Secondary | ICD-10-CM

## 2021-02-22 ENCOUNTER — Ambulatory Visit: Payer: Self-pay

## 2021-03-01 ENCOUNTER — Ambulatory Visit (INDEPENDENT_AMBULATORY_CARE_PROVIDER_SITE_OTHER): Payer: BC Managed Care – PPO

## 2021-03-01 DIAGNOSIS — J309 Allergic rhinitis, unspecified: Secondary | ICD-10-CM | POA: Diagnosis not present

## 2021-03-03 ENCOUNTER — Ambulatory Visit: Payer: Self-pay

## 2021-03-06 ENCOUNTER — Encounter (INDEPENDENT_AMBULATORY_CARE_PROVIDER_SITE_OTHER): Payer: Self-pay | Admitting: Internal Medicine

## 2021-03-06 ENCOUNTER — Other Ambulatory Visit: Payer: Self-pay

## 2021-03-06 ENCOUNTER — Ambulatory Visit (INDEPENDENT_AMBULATORY_CARE_PROVIDER_SITE_OTHER): Payer: BC Managed Care – PPO | Admitting: Internal Medicine

## 2021-03-06 VITALS — BP 122/78 | HR 87 | Temp 97.9°F | Resp 18 | Ht 60.0 in | Wt 118.0 lb

## 2021-03-06 DIAGNOSIS — E2839 Other primary ovarian failure: Secondary | ICD-10-CM | POA: Diagnosis not present

## 2021-03-06 DIAGNOSIS — M797 Fibromyalgia: Secondary | ICD-10-CM | POA: Diagnosis not present

## 2021-03-06 DIAGNOSIS — E559 Vitamin D deficiency, unspecified: Secondary | ICD-10-CM

## 2021-03-06 NOTE — Progress Notes (Signed)
Metrics: Intervention Frequency ACO  Documented Smoking Status Yearly  Screened one or more times in 24 months  Cessation Counseling or  Active cessation medication Past 24 months  Past 24 months   Guideline developer: UpToDate (See UpToDate for funding source) Date Released: 2014       Wellness Office Visit  Subjective:  Patient ID: Sabrina Clark, female    DOB: 11/15/1955  Age: 65 y.o. MRN: [id number]  CC: This lady comes in for follow-up of bioidentical hormone therapy, vitamin D deficiency and fibromyalgia. HPI  She feels great.  She has stopped taking any medications that she previously was for her fibromyalgia.  She continues on NP thyroid 60 mg in the morning and NP thyroid 30 mg at lunchtime. She continues on estradiol 2 mg in the morning and progesterone 200 mg at night. She continues on testosterone 15 mg capsules every day. She also takes vitamin D3 10,000 units daily.  Past Medical History:  Diagnosis Date  . Asthma   . Female hypogonadism syndrome   . Fibromyalgia 07/01/2019  . Malaise and fatigue   . Vitamin D deficiency disease    Past Surgical History:  Procedure Laterality Date  . BREAST BIOPSY Left 2019   x3  . BREAST CYST ASPIRATION    . CYSTECTOMY       Family History  Problem Relation Age of Onset  . Graves' disease Mother   . Fibromyalgia Mother   . Allergic rhinitis Father   . Cancer Father   . Diabetes Father   . Breast cancer Neg Hx   . Angioedema Neg Hx   . Asthma Neg Hx   . Atopy Neg Hx   . Eczema Neg Hx   . Immunodeficiency Neg Hx   . Urticaria Neg Hx     Social History   Social History Narrative   Married 11 years,third.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   Social History   Tobacco Use  . Smoking status: Never Smoker  . Smokeless tobacco: Never Used  Substance Use Topics  . Alcohol use: Yes    Alcohol/week: 7.0 standard drinks    Types: 7 Glasses of wine per week    Current Meds  Medication Sig  . albuterol (VENTOLIN  HFA) 108 (90 Base) MCG/ACT inhaler Inhale 2 puffs into the lungs every 4 (four) hours as needed for wheezing or shortness of breath.  . butalbital-acetaminophen-caffeine (FIORICET) 50-325-40 MG tablet Take 1 tablet by mouth daily as needed.  . Cholecalciferol (VITAMIN D3) 250 MCG (10000 UT) TABS Take 1 capsule by mouth daily.  . clonazePAM (KLONOPIN) 0.5 MG tablet Take 1 tablet (0.5 mg total) by mouth daily as needed.  Marland Kitchen EPINEPHrine (AUVI-Q) 0.3 mg/0.3 mL IJ SOAJ injection Inject 0.3 mLs (0.3 mg total) into the muscle as needed for anaphylaxis.  Marland Kitchen estradiol (ESTRACE) 2 MG tablet Take 1 tablet (2 mg total) by mouth daily.  . fexofenadine (ALLEGRA) 180 MG tablet Take 180 mg by mouth daily. Now taking daily per allergist.  . ibuprofen (ADVIL,MOTRIN) 800 MG tablet Take 1 tablet (800 mg total) by mouth 3 (three) times daily.  . Misc Natural Products (NF FORMULAS TESTOSTERONE) CAPS Take 15 mg by mouth daily at 12 noon. Compounded  . NP THYROID 30 MG tablet Take 1 tablet (30 mg total) by mouth every evening.  . NP THYROID 60 MG tablet Take 1 tablet (60 mg total) by mouth 2 (two) times daily. (Patient taking differently: Take 60 mg by mouth daily before breakfast.)  .  progesterone (PROMETRIUM) 100 MG capsule TAKE 3 CAPSULES BY MOUTH ONCE DAILY AT NIGHT  . tiZANidine (ZANAFLEX) 2 MG tablet Take 1 tablet (2 mg total) by mouth every evening.  . triamcinolone (NASACORT) 55 MCG/ACT AERO nasal inhaler Place 1 spray into the nose daily.     Flowsheet Row Office Visit from 09/01/2020 in [facility]  PHQ-9 Total Score 0      Objective:   Today's Vitals: BP 122/78 (BP Location: Right Arm, Patient Position: Sitting, Cuff Size: Normal)   Pulse 87   Temp 97.9 F (36.6 C) (Temporal)   Resp 18   Ht 5' (1.524 m)   Wt 118 lb (53.5 kg)   SpO2 99%   BMI 23.05 kg/m  Vitals with BMI 03/06/2021 10/09/2020 10/09/2020  Height 5\' 0"  - 5\' 0"   Weight 118 lbs - 112 lbs  BMI 23.05 - 21.87  Systolic 122  124 -  Diastolic 78 72 -  Pulse 87 76 -     Physical Exam  She looks systemically well.  No new physical findings.     Assessment   1. Female hypogonadism syndrome   2. Vitamin D deficiency disease   3. Fibromyalgia       Tests ordered Orders Placed This Encounter  Procedures  . Estradiol  . Progesterone     Plan: 1. Continue with current doses of estradiol and progesterone and we will check levels today. 2. Continue with testosterone therapy as before.  Her levels are stable and she feels good on this dose. 3. Continue with vitamin D3 10,000 units daily. 4. I will see her in 6 months time for an annual physical exam.   No orders of the defined types were placed in this encounter.   , MD

## 2021-03-06 NOTE — Progress Notes (Signed)
Have a few medication changes: Went over all edit & changes made.  She will tell you about the progesterone & thyroid changes on Rx.

## 2021-03-07 LAB — PROGESTERONE: Progesterone: 26.4 ng/mL

## 2021-03-07 LAB — ESTRADIOL: Estradiol: 108 pg/mL

## 2021-03-14 ENCOUNTER — Telehealth (INDEPENDENT_AMBULATORY_CARE_PROVIDER_SITE_OTHER): Payer: Self-pay

## 2021-03-16 ENCOUNTER — Other Ambulatory Visit (INDEPENDENT_AMBULATORY_CARE_PROVIDER_SITE_OTHER): Payer: Self-pay | Admitting: Internal Medicine

## 2021-03-16 NOTE — Telephone Encounter (Signed)
Open think when pt called. But she was calling for her spouse.. error.

## 2021-03-24 ENCOUNTER — Ambulatory Visit (INDEPENDENT_AMBULATORY_CARE_PROVIDER_SITE_OTHER): Payer: BC Managed Care – PPO

## 2021-03-24 DIAGNOSIS — J309 Allergic rhinitis, unspecified: Secondary | ICD-10-CM | POA: Diagnosis not present

## 2021-04-05 ENCOUNTER — Ambulatory Visit (INDEPENDENT_AMBULATORY_CARE_PROVIDER_SITE_OTHER): Payer: BC Managed Care – PPO

## 2021-04-05 DIAGNOSIS — J309 Allergic rhinitis, unspecified: Secondary | ICD-10-CM

## 2021-04-19 ENCOUNTER — Ambulatory Visit (INDEPENDENT_AMBULATORY_CARE_PROVIDER_SITE_OTHER): Payer: BC Managed Care – PPO

## 2021-04-19 DIAGNOSIS — J309 Allergic rhinitis, unspecified: Secondary | ICD-10-CM

## 2021-04-25 ENCOUNTER — Other Ambulatory Visit (INDEPENDENT_AMBULATORY_CARE_PROVIDER_SITE_OTHER): Payer: Self-pay | Admitting: Internal Medicine

## 2021-05-03 ENCOUNTER — Ambulatory Visit (INDEPENDENT_AMBULATORY_CARE_PROVIDER_SITE_OTHER): Payer: BC Managed Care – PPO | Admitting: *Deleted

## 2021-05-03 DIAGNOSIS — J309 Allergic rhinitis, unspecified: Secondary | ICD-10-CM | POA: Diagnosis not present

## 2021-05-17 ENCOUNTER — Ambulatory Visit (INDEPENDENT_AMBULATORY_CARE_PROVIDER_SITE_OTHER): Payer: BC Managed Care – PPO

## 2021-05-17 DIAGNOSIS — J309 Allergic rhinitis, unspecified: Secondary | ICD-10-CM | POA: Diagnosis not present

## 2021-05-26 ENCOUNTER — Ambulatory Visit (INDEPENDENT_AMBULATORY_CARE_PROVIDER_SITE_OTHER): Payer: BC Managed Care – PPO | Admitting: Family Medicine

## 2021-05-26 ENCOUNTER — Encounter: Payer: Self-pay | Admitting: Family Medicine

## 2021-05-26 ENCOUNTER — Other Ambulatory Visit: Payer: Self-pay

## 2021-05-26 VITALS — BP 126/66 | HR 67 | Temp 97.4°F | Resp 17 | Ht 60.0 in | Wt 117.6 lb

## 2021-05-26 DIAGNOSIS — J3089 Other allergic rhinitis: Secondary | ICD-10-CM

## 2021-05-26 DIAGNOSIS — E2839 Other primary ovarian failure: Secondary | ICD-10-CM | POA: Diagnosis not present

## 2021-05-26 DIAGNOSIS — E559 Vitamin D deficiency, unspecified: Secondary | ICD-10-CM | POA: Diagnosis not present

## 2021-05-26 DIAGNOSIS — J4540 Moderate persistent asthma, uncomplicated: Secondary | ICD-10-CM

## 2021-05-26 DIAGNOSIS — J302 Other seasonal allergic rhinitis: Secondary | ICD-10-CM

## 2021-05-26 DIAGNOSIS — M797 Fibromyalgia: Secondary | ICD-10-CM | POA: Diagnosis not present

## 2021-05-26 DIAGNOSIS — G4700 Insomnia, unspecified: Secondary | ICD-10-CM

## 2021-05-26 LAB — TSH: TSH: 1.32 u[IU]/mL (ref 0.35–5.50)

## 2021-05-26 MED ORDER — NP THYROID 60 MG PO TABS: 60.0000 mg | ORAL_TABLET | Freq: Every day | ORAL | 1 refills | Status: DC

## 2021-05-26 MED ORDER — ESTRADIOL 2 MG PO TABS: 2.0000 mg | ORAL_TABLET | Freq: Every day | ORAL | 0 refills | Status: DC

## 2021-05-26 MED ORDER — NP THYROID 30 MG PO TABS: 30.0000 mg | ORAL_TABLET | Freq: Every evening | ORAL | 1 refills | Status: DC

## 2021-05-26 MED ORDER — TIZANIDINE HCL 2 MG PO TABS: 2.0000 mg | ORAL_TABLET | Freq: Every evening | ORAL | 0 refills | Status: DC

## 2021-05-26 NOTE — Progress Notes (Signed)
Subjective:  Patient ID: Sabrina Clark, female    DOB: Aug 23, 1956  Age: 65 y.o. 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  History of fibromyalgia, asthma, vitamin D deficiency, female hypogonadism syndrome, allergic rhinitis.  Female hypogonadism syndrome: Treated with NP thyroid, progesterone, estradiol by prior primary provider - feels much better on current regimen - more energy. Not sure if levels were low prior, or low normal. Takes np thyroid 60and 30mg  per day.one of each.   Fibromalgia Tizanidine once per month if needed for back at bedtime if needed. Defers tdap today.   Insomnia/anxiety Klonopin about once per month - 1/2 or 1/4 to help sleep or flair of anxiety. Last filled 10/19/20 for #30. Still has some at home.   Allergy/asthma: Takes fioricet spring and fall during allergy flares up to one per dayfor 1 week.  better with allergy treatment, allergist Dr. 05-06-1991. Allergy shots.  Rare need albuterol for asthma - few times per month during allergy season. Nasacort, allegra for allergies daily.   Vit D deficiency: 10,000iu per day vit D.  Last vitamin D Lab Results  Component Value Date   VD25OH 105 (H) 02/08/2020      History Patient Active Problem List   Diagnosis Date Noted   Fibromyalgia 07/01/2019   Vitamin D deficiency disease    Female hypogonadism syndrome    Asthma    Malaise and fatigue    Seasonal and perennial allergic rhinitis 12/19/2018   Moderate persistent asthma, uncomplicated 12/19/2018   Past Medical History:  Diagnosis Date   Asthma    Female  hypogonadism syndrome    Fibromyalgia 07/01/2019   Malaise and fatigue    Vitamin D deficiency disease    Past Surgical History:  Procedure Laterality Date   BREAST BIOPSY Left 2019   x3   BREAST CYST ASPIRATION     CYSTECTOMY     Allergies  Allergen Reactions   Codeine Hives    Itch     Cefdinir    Diphenhydramine Hcl Hives   Prior to Admission medications   Medication Sig Start Date End Date Taking? Authorizing Provider  albuterol (VENTOLIN HFA) 108 (90 Base) MCG/ACT inhaler Inhale 2 puffs into the lungs every 4 (four) hours as needed for wheezing or shortness of breath. 05/11/20  Yes 05/13/20, FNP  butalbital-acetaminophen-caffeine (FIORICET) 50-325-40 MG tablet Take 1 tablet by mouth daily as needed. 02/13/21  Yes Gosrani, Nimish C, MD  Cholecalciferol (VITAMIN D3) 250 MCG (10000 UT) TABS Take 1 capsule by mouth daily.   Yes [provider]  clonazePAM (KLONOPIN) 0.5 MG tablet Take 1 tablet (0.5 mg total) by mouth daily as needed. 10/19/20  Yes Gosrani, Nimish C, MD  EPINEPHrine (AUVI-Q) 0.3 mg/0.3 mL IJ SOAJ injection Inject 0.3 mLs (0.3 mg total) into the muscle as needed for anaphylaxis. 02/05/20  Yes 02/07/20, MD  estradiol (ESTRACE) 2 MG tablet Take 1 tablet (2 mg total) by mouth daily. 02/13/21  Yes Gosrani, Nimish C, MD  fexofenadine (ALLEGRA) 180 MG tablet Take 180  mg by mouth daily. Now taking daily per allergist.   Yes [provider]  ibuprofen (ADVIL,MOTRIN) 800 MG tablet Take 1 tablet (800 mg total) by mouth 3 (three) times daily. 01/15/16  Yes Eber Hong, MD  Misc Natural Products (NF FORMULAS TESTOSTERONE) CAPS 1 capsule daily by mouth 04/25/21  Yes Wilson Singer, MD  NP THYROID 60 MG tablet Take 1 tablet (60 mg total) by mouth 2 (two) times daily. Patient taking differently: Take 60 mg by mouth daily before breakfast. 02/13/21  Yes Gosrani, Nimish C, MD  progesterone (PROMETRIUM) 100 MG capsule Take 3 capsules (300 mg total) by mouth  at bedtime. 03/16/21  Yes Gosrani, Nimish C, MD  tiZANidine (ZANAFLEX) 2 MG tablet Take 1 tablet (2 mg total) by mouth every evening. 10/19/20  Yes Gosrani, Nimish C, MD  triamcinolone (NASACORT) 55 MCG/ACT AERO nasal inhaler Place 1 spray into the nose daily. 12/19/18  Yes Alfonse Spruce, MD  NP THYROID 30 MG tablet Take 1 tablet (30 mg total) by mouth every evening. 09/26/20 10/26/20  Wilson Singer, MD   Social History   Socioeconomic History   Marital status: Married    Spouse name: Not on file   Number of children: Not on file   Years of education: Not on file   Highest education level: Not on file  Occupational History   Not on file  Tobacco Use   Smoking status: Never   Smokeless tobacco: Never  Vaping Use   Vaping Use: Never used  Substance and Sexual Activity   Alcohol use: Yes    Alcohol/week: 7.0 standard drinks    Types: 7 Glasses of wine per week    Comment: sometimes less   Drug use: No   Sexual activity: Yes  Other Topics Concern   Not on file  Social History Narrative   Married 11 years,third.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   Social Determinants of Health   Financial Resource Strain: Not on file  Food Insecurity: Not on file  Transportation Needs: Not on file  Physical Activity: Not on file  Stress: Not on file  Social Connections: Not on file  Intimate Partner Violence: Not on file    Review of Systems   Objective:   Vitals:   05/26/21 1106  BP: 126/66  Pulse: 67  Resp: 17  Temp: (!) 97.4 F (36.3 C)  TempSrc: Temporal  SpO2: 98%  Weight: 117 lb 9.6 oz (53.3 kg)  Height: 5' (1.524 m)     Physical Exam Vitals reviewed.  Constitutional:      Appearance: Normal appearance. She is well-developed.  HENT:     Head: Normocephalic and atraumatic.  Eyes:     Conjunctiva/sclera: Conjunctivae normal.     Pupils: Pupils are equal, round, and reactive to light.  Neck:     Vascular: No carotid bruit.     Comments: No  nodules/thyromegaly.  Cardiovascular:     Rate and Rhythm: Normal rate and regular rhythm.     Heart sounds: Normal heart sounds.  Pulmonary:     Effort: Pulmonary effort is normal.     Breath sounds: Normal breath sounds.  Abdominal:     Palpations: Abdomen is soft. There is no pulsatile mass.     Tenderness: There is no abdominal tenderness.  Musculoskeletal:     Right lower leg: No edema.     Left lower leg: No edema.  Skin:    General: Skin is warm and dry.  Neurological:  Mental Status: She is alert and oriented to person, place, and time.  Psychiatric:        Mood and Affect: Mood normal.        Behavior: Behavior normal.     42 minutes spent during visit, including chart review, counseling and assimilation of information, exam, discussion of plan, and chart completion.    Assessment & Plan:  Sabrina Clark is a 65 y.o. female . Moderate persistent asthma, uncomplicated  -Well-controlled with current regimen.  Continue follow-up with allergist, has albuterol if needed, continue Allegra, steroid nasal spray.  Rare use of Fioricet during headaches of allergy flares, no changes for now but if persistent use may need headache specialist evaluation or change in meds.  Female hypogonadism syndrome - Plan: estradiol (ESTRACE) 2 MG tablet, NP THYROID 60 MG tablet, NP THYROID 30 MG tablet, Ambulatory referral to Endocrinology, TSH  -We will refer to endocrinology to discuss her medications and seen care, or can follow-up with [facility] if needed.  I will refill medication temporarily as well as check TSH, vitamin D today.  Fibromyalgia - Plan: tiZANidine (ZANAFLEX) 2 MG tablet  -Overall stable with rare use of tizanidine, refilled.  Vitamin D deficiency disease - Plan: Vitamin D (25 hydroxy)  -Elevated reading noted from recent labs.  May not need his higher dose of vitamin D.  Check levels to determine dosing  Seasonal and perennial allergic  rhinitis  -Controlled as above, continue follow-up with allergist.  Insomnia Rare, well controlled with rare use of clonazepam, continue same.  RTC precautions if increased need/use  Meds ordered this encounter  Medications   estradiol (ESTRACE) 2 MG tablet    Sig: Take 1 tablet (2 mg total) by mouth daily.    Dispense:  90 tablet    Refill:  0   NP THYROID 60 MG tablet    Sig: Take 1 tablet (60 mg total) by mouth daily before breakfast.    Dispense:  30 tablet    Refill:  1   NP THYROID 30 MG tablet    Sig: Take 1 tablet (30 mg total) by mouth every evening.    Dispense:  30 tablet    Refill:  1   tiZANidine (ZANAFLEX) 2 MG tablet    Sig: Take 1 tablet (2 mg total) by mouth every evening.    Dispense:  30 tablet    Refill:  0   Patient Instructions  Thank you for coming to see [place] today.  I will place a referral to endocrinology to see if they would be able to monitor and refill your hormone medication.  There is also an option below for another integrative medical provider in the area.  I will check your vitamin D and TSH levels today, and did refill meds temporarily.  If you require the Klonopin more often or tizanidine more often, please follow-up and we can discuss possible changes but no changes for now.  Recheck in the next 3 months but let me know if there are questions sooner.  Take care!   Option for integrative medicine practice: [facility] Alternative medicine practitioner in [place], [place] Address: [address] [place], [place], [place] [address] Phone: [phone]    Signed,   Meredith Staggers, MD [facility], [facility] 05/26/21 1:21 PM

## 2021-05-26 NOTE — Patient Instructions (Signed)
Thank you for coming to see [place] today.  I will place a referral to endocrinology to see if they would be able to monitor and refill your hormone medication.  There is also an option below for another integrative medical provider in the area.  I will check your vitamin D and TSH levels today, and did refill meds temporarily.  If you require the Klonopin more often or tizanidine more often, please follow-up and we can discuss possible changes but no changes for now.  Recheck in the next 3 months but let me know if there are questions sooner.  Take care!   Option for integrative medicine practice: [facility] Alternative medicine practitioner in [place], [place] Address: [address] [place], [place], [place] [address] Phone: [phone]

## 2021-05-29 ENCOUNTER — Other Ambulatory Visit: Payer: Self-pay | Admitting: Family Medicine

## 2021-05-29 DIAGNOSIS — R7989 Other specified abnormal findings of blood chemistry: Secondary | ICD-10-CM

## 2021-05-29 LAB — VITAMIN D 25 HYDROXY (VIT D DEFICIENCY, FRACTURES): VITD: >120 ng/mL

## 2021-05-29 NOTE — Progress Notes (Signed)
See labs 

## 2021-05-31 ENCOUNTER — Ambulatory Visit (INDEPENDENT_AMBULATORY_CARE_PROVIDER_SITE_OTHER): Payer: BC Managed Care – PPO

## 2021-05-31 DIAGNOSIS — J309 Allergic rhinitis, unspecified: Secondary | ICD-10-CM

## 2021-06-09 DIAGNOSIS — N951 Menopausal and female climacteric states: Secondary | ICD-10-CM | POA: Diagnosis not present

## 2021-06-09 DIAGNOSIS — E039 Hypothyroidism, unspecified: Secondary | ICD-10-CM | POA: Diagnosis not present

## 2021-06-14 DIAGNOSIS — M2559 Pain in other specified joint: Secondary | ICD-10-CM | POA: Diagnosis not present

## 2021-06-14 DIAGNOSIS — N951 Menopausal and female climacteric states: Secondary | ICD-10-CM | POA: Diagnosis not present

## 2021-06-14 DIAGNOSIS — M797 Fibromyalgia: Secondary | ICD-10-CM | POA: Diagnosis not present

## 2021-06-14 DIAGNOSIS — R5383 Other fatigue: Secondary | ICD-10-CM | POA: Diagnosis not present

## 2021-06-21 ENCOUNTER — Ambulatory Visit (INDEPENDENT_AMBULATORY_CARE_PROVIDER_SITE_OTHER): Payer: BC Managed Care – PPO

## 2021-06-21 DIAGNOSIS — J309 Allergic rhinitis, unspecified: Secondary | ICD-10-CM | POA: Diagnosis not present

## 2021-06-21 NOTE — Progress Notes (Signed)
Patient's mom Sabrina Clark who is also a shot patient is moving into an assisted living facility in [place] and both patients would like their vials sent to the [employer] office. Vials have been packed up and will be sent to the [facility] office tomorrow.

## 2021-06-26 NOTE — Progress Notes (Signed)
VIALS MADE. EXP 06-26-22 

## 2021-06-28 DIAGNOSIS — J301 Allergic rhinitis due to pollen: Secondary | ICD-10-CM | POA: Diagnosis not present

## 2021-07-04 DIAGNOSIS — H2513 Age-related nuclear cataract, bilateral: Secondary | ICD-10-CM | POA: Diagnosis not present

## 2021-07-10 ENCOUNTER — Telehealth: Payer: Self-pay | Admitting: Allergy & Immunology

## 2021-07-13 ENCOUNTER — Ambulatory Visit (INDEPENDENT_AMBULATORY_CARE_PROVIDER_SITE_OTHER): Payer: BC Managed Care – PPO | Admitting: Allergy & Immunology

## 2021-07-13 ENCOUNTER — Other Ambulatory Visit: Payer: Self-pay

## 2021-07-13 ENCOUNTER — Encounter: Payer: Self-pay | Admitting: Allergy & Immunology

## 2021-07-13 VITALS — BP 110/60 | HR 81 | Temp 98.4°F | Resp 16 | Ht 60.0 in | Wt 120.6 lb

## 2021-07-13 DIAGNOSIS — J4520 Mild intermittent asthma, uncomplicated: Secondary | ICD-10-CM

## 2021-07-13 DIAGNOSIS — N951 Menopausal and female climacteric states: Secondary | ICD-10-CM | POA: Diagnosis not present

## 2021-07-13 DIAGNOSIS — J309 Allergic rhinitis, unspecified: Secondary | ICD-10-CM | POA: Diagnosis not present

## 2021-07-13 DIAGNOSIS — J302 Other seasonal allergic rhinitis: Secondary | ICD-10-CM

## 2021-07-13 DIAGNOSIS — E039 Hypothyroidism, unspecified: Secondary | ICD-10-CM | POA: Diagnosis not present

## 2021-07-13 MED ORDER — ALBUTEROL SULFATE HFA 108 (90 BASE) MCG/ACT IN AERS: 2.0000 | INHALATION_SPRAY | RESPIRATORY_TRACT | 1 refills | Status: DC | PRN

## 2021-07-13 NOTE — Patient Instructions (Addendum)
1. Mild intermittent asthma, uncomplicated - Lung testing looked good today.   - Daily controller medication(s): NOTHING - Prior to physical activity: albuterol 2 puffs 10-15 minutes before physical activity. - Rescue medications: albuterol 4 puffs every 4-6 hours as needed - Asthma control goals:  * Full participation in all desired activities (may need albuterol before activity) * Albuterol use two time or less a week on average (not counting use with activity) * Cough interfering with sleep two time or less a month * Oral steroids no more than once a year * No hospitalizations  2.Seasonal and perennial allergic rhinitis (ragweed, weeds, grasses, indoor molds, dust mites, cat and dog) - Continue with allergy shots frozen at 0.15 cc every two weeks (we will go to every three weeks at the next vial and then monthly at the vial after that).  - Continue with: Allegra (fexofenadine) 180mg  table once daily and Nasacort (triamcinolone) one spray per nostril daily. - May take an extra dose of Allegra on the days your allergy symptoms are worse.  3. Return in about 1 year (around 07/13/2022).    Please inform 07/15/2022 of any Emergency Department visits, hospitalizations, or changes in symptoms. Call us before going to the ED for breathing or allergy symptoms since we might be able to fit you in for a sick visit. Feel free to contact us anytime with any questions, problems, or concerns.  It was a pleasure to see you again today! I am so glad that your mother is doing so well!   Websites that have reliable patient information: 1. American Academy of Asthma, Allergy, and Immunology: www.aaaai.org 2. Food Allergy Research and Education (FARE): foodallergy.org 3. Mothers of Asthmatics: http://www.asthmacommunitynetwork.org 4. American College of Allergy, Asthma, and Immunology: www.acaai.org   COVID-19 Vaccine Information can be found at:  [place] For questions related to vaccine distribution or appointments, please email vaccine@ [email or web address] or call [phone].   We realize that you might be concerned about having an allergic reaction to the COVID19 vaccines. To help with that concern, WE ARE OFFERING THE COVID19 VACCINES IN OUR OFFICE! Ask the front desk for dates!     "Like" [phone] on Facebook and Instagram for our latest updates!      A healthy democracy works best when [place] participate! Make sure you are registered to vote! If you have moved or changed any of your contact information, you will need to get this updated before voting!  In some cases, you MAY be able to register to vote online: [employer]

## 2021-07-13 NOTE — Progress Notes (Signed)
FOLLOW UP  Date of Service/Encounter:  07/13/21   Assessment:   Moderate persistent asthma, uncomplicated    Seasonal and perennial allergic rhinitis (ragweed, weeds, grasses, indoor molds, dust mites, cat and dog)  Plan/Recommendations:   1. Mild intermittent asthma, uncomplicated - Lung testing looked good today.   - Daily controller medication(s): NOTHING - Prior to physical activity: albuterol 2 puffs 10-15 minutes before physical activity. - Rescue medications: albuterol 4 puffs every 4-6 hours as needed - Asthma control goals:  * Full participation in all desired activities (may need albuterol before activity) * Albuterol use two time or less a week on average (not counting use with activity) * Cough interfering with sleep two time or less a month * Oral steroids no more than once a year * No hospitalizations  2.Seasonal and perennial allergic rhinitis (ragweed, weeds, grasses, indoor molds, dust mites, cat and dog) - Continue with allergy shots frozen at 0.15 cc every two weeks (we will go to every three weeks at the next vial and then monthly at the vial after that).  - Continue with: Allegra (fexofenadine) 180mg  table once daily and Nasacort (triamcinolone) one spray per nostril daily. - May take an extra dose of Allegra on the days your allergy symptoms are worse.  3. Return in about 1 year (around 07/13/2022).   Subjective:   Sabrina Clark is a 65 y.o. female presenting today for follow up of  Chief Complaint  Patient presents with   Follow-up    Patient in for her yearly follow up and has no issues to report.    Sabrina Clark has a history of the following: Patient Active Problem List   Diagnosis Date Noted   Fibromyalgia 07/01/2019   Vitamin D deficiency disease    Female hypogonadism syndrome    Asthma    Malaise and fatigue    Seasonal and perennial allergic rhinitis 12/19/2018   Moderate persistent asthma, uncomplicated 12/19/2018    History  obtained from: chart review and patient.  Sabrina Clark is a 65 y.o. female presenting for a follow up visit.  She was last seen in July 2021 and was doing well at that time.  She was having some large local reactions we decided to freeze her injections at 0.15 mL of her red vial.  Since that time, she seems to have tolerated it well.  Asthma has been under control with albuterol as needed.  Since the last visit, she has done well.    Asthma/Respiratory Symptom History: She is doing very well from an asthma perspective. She does not use it very often and one has actually gone out of date.  She has not had prednisone, urgent care, or emergency room visits. Shantai's asthma has been well controlled. She has not required rescue medication, experienced nocturnal awakenings due to lower respiratory symptoms, nor have activities of daily living been limited. She has required no Emergency Department or Urgent Care visits for her asthma. She has required zero courses of systemic steroids for asthma exacerbations since the last visit. ACT score today is 25, indicating excellent asthma symptom control.   Allergic Rhinitis Symptom History: She continues on her allergy shots. They are going very well. Overall this has bene life changing for her. SHe hsa not been sick at all. Sinus infections and bronchitis is gone.   Her mother has now moved to [place] at [facility] here in [place]. Her mother made the decision very quickly. They cook one meal a day for her.  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   She recently got for goats.  Unfortunately within a week they escaped.  She rounded them all up and unfortunately 3 of the 4 goats have now died.  Her other animals are doing fairly good.  The chickens are still laying. The pigs seem happy.   Otherwise, there have been no changes to her past medical history, surgical history, family history, or social history.    Review of  Systems  Constitutional: Negative.  Negative for chills, fever, malaise/fatigue and weight loss.  HENT:  Negative for congestion, ear discharge, ear pain and sinus pain.   Eyes:  Negative for pain, discharge and redness.  Respiratory:  Negative for cough, sputum production, shortness of breath and wheezing.   Cardiovascular: Negative.  Negative for chest pain and palpitations.  Gastrointestinal:  Negative for abdominal pain, constipation, diarrhea, heartburn, nausea and vomiting.  Skin: Negative.  Negative for itching and rash.  Neurological:  Negative for dizziness and headaches.  Endo/Heme/Allergies:  Negative for environmental allergies. Does not bruise/bleed easily.      Objective:   Blood pressure 110/60, pulse 81, temperature 98.4 F (36.9 C), temperature source Temporal, resp. rate 16, height 5' (1.524 m), weight 120 lb 9.6 oz (54.7 kg), SpO2 97 %. Body mass index is 23.55 kg/m.   Physical Exam:  Physical Exam Vitals reviewed.  Constitutional:      Appearance: She is well-developed.     Comments: Very talkative.  HENT:     Head: Normocephalic and atraumatic.     Right Ear: Tympanic membrane, ear canal and external ear normal.     Left Ear: Tympanic membrane, ear canal and external ear normal.     Nose: No nasal deformity, septal deviation, mucosal edema or rhinorrhea.     Right Turbinates: Enlarged and swollen.     Left Turbinates: Enlarged and swollen.     Right Sinus: No maxillary sinus tenderness or frontal sinus tenderness.     Left Sinus: No maxillary sinus tenderness or frontal sinus tenderness.     Mouth/Throat:     Mouth: Mucous membranes are not pale and not dry.     Pharynx: Uvula midline.  Eyes:     General: Lids are normal. No allergic shiner.       Right eye: No discharge.        Left eye: No discharge.     Conjunctiva/sclera: Conjunctivae normal.     Right eye: Right conjunctiva is not injected. No chemosis.    Left eye: Left conjunctiva is not  injected. No chemosis.    Pupils: Pupils are equal, round, and reactive to light.  Cardiovascular:     Rate and Rhythm: Normal rate and regular rhythm.     Heart sounds: Normal heart sounds.  Pulmonary:     Effort: Pulmonary effort is normal. No tachypnea, accessory muscle usage or respiratory distress.     Breath sounds: Normal breath sounds. No wheezing, rhonchi or rales.     Comments: Moving air well in all lung fields.  No increased work of breathing. Chest:     Chest wall: No tenderness.  Lymphadenopathy:     Cervical: No cervical adenopathy.  Skin:    General: Skin is warm.     Capillary Refill: Capillary refill takes less than 2 seconds.     Coloration: Skin is not pale.     Findings: No abrasion, erythema, petechiae or rash. Rash is  not papular, urticarial or vesicular.     Comments: No eczematous or urticarial lesions noted.  Neurological:     Mental Status: She is alert.  Psychiatric:        Behavior: Behavior is cooperative.     Diagnostic studies: none       Malachi Bonds, MD  [facility]

## 2021-07-17 DIAGNOSIS — M2559 Pain in other specified joint: Secondary | ICD-10-CM | POA: Diagnosis not present

## 2021-07-17 DIAGNOSIS — R5383 Other fatigue: Secondary | ICD-10-CM | POA: Diagnosis not present

## 2021-07-17 DIAGNOSIS — Z6822 Body mass index (BMI) 22.0-22.9, adult: Secondary | ICD-10-CM | POA: Diagnosis not present

## 2021-07-17 DIAGNOSIS — N951 Menopausal and female climacteric states: Secondary | ICD-10-CM | POA: Diagnosis not present

## 2021-07-20 ENCOUNTER — Ambulatory Visit (INDEPENDENT_AMBULATORY_CARE_PROVIDER_SITE_OTHER): Payer: BC Managed Care – PPO | Admitting: *Deleted

## 2021-07-20 DIAGNOSIS — J309 Allergic rhinitis, unspecified: Secondary | ICD-10-CM

## 2021-07-20 DIAGNOSIS — M79672 Pain in left foot: Secondary | ICD-10-CM | POA: Diagnosis not present

## 2021-07-27 ENCOUNTER — Ambulatory Visit (INDEPENDENT_AMBULATORY_CARE_PROVIDER_SITE_OTHER): Payer: BC Managed Care – PPO | Admitting: *Deleted

## 2021-07-27 DIAGNOSIS — J309 Allergic rhinitis, unspecified: Secondary | ICD-10-CM

## 2021-08-11 ENCOUNTER — Ambulatory Visit (INDEPENDENT_AMBULATORY_CARE_PROVIDER_SITE_OTHER): Payer: BC Managed Care – PPO

## 2021-08-11 DIAGNOSIS — J309 Allergic rhinitis, unspecified: Secondary | ICD-10-CM

## 2021-08-14 DIAGNOSIS — M25551 Pain in right hip: Secondary | ICD-10-CM | POA: Diagnosis not present

## 2021-08-22 DIAGNOSIS — M25511 Pain in right shoulder: Secondary | ICD-10-CM | POA: Diagnosis not present

## 2021-08-22 DIAGNOSIS — R531 Weakness: Secondary | ICD-10-CM | POA: Diagnosis not present

## 2021-08-22 DIAGNOSIS — M25551 Pain in right hip: Secondary | ICD-10-CM | POA: Diagnosis not present

## 2021-08-23 ENCOUNTER — Ambulatory Visit (INDEPENDENT_AMBULATORY_CARE_PROVIDER_SITE_OTHER): Payer: BC Managed Care – PPO

## 2021-08-23 DIAGNOSIS — J309 Allergic rhinitis, unspecified: Secondary | ICD-10-CM | POA: Diagnosis not present

## 2021-08-28 ENCOUNTER — Ambulatory Visit: Payer: BC Managed Care – PPO | Admitting: Family Medicine

## 2021-08-28 DIAGNOSIS — R531 Weakness: Secondary | ICD-10-CM | POA: Diagnosis not present

## 2021-08-28 DIAGNOSIS — M25551 Pain in right hip: Secondary | ICD-10-CM | POA: Diagnosis not present

## 2021-08-28 DIAGNOSIS — M25511 Pain in right shoulder: Secondary | ICD-10-CM | POA: Diagnosis not present

## 2021-09-03 ENCOUNTER — Other Ambulatory Visit: Payer: Self-pay

## 2021-09-04 ENCOUNTER — Ambulatory Visit
Admission: EM | Admit: 2021-09-04 | Discharge: 2021-09-04 | Disposition: A | Discharge status: Home / Self Care | Payer: BC Managed Care – PPO | Attending: Internal Medicine | Admitting: Internal Medicine

## 2021-09-04 ENCOUNTER — Other Ambulatory Visit: Payer: Self-pay

## 2021-09-04 VITALS — BP 117/71 | HR 74 | Temp 98.7°F | Resp 16

## 2021-09-04 DIAGNOSIS — J4521 Mild intermittent asthma with (acute) exacerbation: Secondary | ICD-10-CM

## 2021-09-04 MED ORDER — PREDNISONE 20 MG PO TABS: 20.0000 mg | ORAL_TABLET | Freq: Every day | ORAL | 0 refills | Status: AC

## 2021-09-04 MED ORDER — IPRATROPIUM BROMIDE 0.03 % NA SOLN: 2.0000 | Freq: Two times a day (BID) | NASAL | 12 refills | Status: DC

## 2021-09-04 MED ORDER — BENZONATATE 100 MG PO CAPS: 100.0000 mg | ORAL_CAPSULE | Freq: Three times a day (TID) | ORAL | 0 refills | Status: DC | PRN

## 2021-09-04 NOTE — ED Triage Notes (Signed)
Cough, headache, fatigue, chest congestion; started last week.

## 2021-09-04 NOTE — Discharge Instructions (Signed)
Please take medications as prescribed Use albuterol inhaler as needed No indication for COVID or flu testing given the duration of symptoms If symptoms worsen please return to urgent care to be reevaluated.

## 2021-09-04 NOTE — ED Provider Notes (Signed)
[facility]    CSN: [id number] Arrival date & time: 09/04/21  0849      History   Chief Complaint Chief Complaint  Patient presents with   Appointment    cough    HPI Sabrina Clark is a 65 y.o. female with a history of seasonal allergies and asthma comes to urgent care with 1 week history of nasal congestion, postnasal drainage, nonproductive cough and chest tightness as well as wheezing.  Patient's symptoms started about a week ago and has been persistent.  She recently started using albuterol inhaler with no significant improvement in shortness of breath.  Patient denies any chest pain.  No fever or chills.  No dizziness, near syncopal or syncopal episodes.Marland Kitchen   HPI  Past Medical History:  Diagnosis Date   Asthma    Female hypogonadism syndrome    Fibromyalgia 07/01/2019   Malaise and fatigue    Vitamin D deficiency disease     Patient Active Problem List   Diagnosis Date Noted   Fibromyalgia 07/01/2019   Vitamin D deficiency disease    Female hypogonadism syndrome    Asthma    Malaise and fatigue    Seasonal and perennial allergic rhinitis 12/19/2018   Moderate persistent asthma, uncomplicated 12/19/2018    Past Surgical History:  Procedure Laterality Date   BREAST BIOPSY Left 2019   x3   BREAST CYST ASPIRATION     CYSTECTOMY      OB History   No obstetric history on file.      Home Medications    Prior to Admission medications   Medication Sig Start Date End Date Taking? Authorizing Provider  benzonatate (TESSALON) 100 MG capsule Take 1 capsule (100 mg total) by mouth 3 (three) times daily as needed for cough. 09/04/21  Yes Liahna Brickner, Britta Mccreedy, MD  ipratropium (ATROVENT) 0.03 % nasal spray Place 2 sprays into both nostrils every 12 (twelve) hours. 09/04/21  Yes Keyri Salberg, Britta Mccreedy, MD  predniSONE (DELTASONE) 20 MG tablet Take 1 tablet (20 mg total) by mouth daily for 5 days. 09/04/21 09/09/21 Yes Efraim Vanallen, Britta Mccreedy, MD  albuterol (VENTOLIN HFA)  108 (90 Base) MCG/ACT inhaler Inhale 2 puffs into the lungs every 4 (four) hours as needed for wheezing or shortness of breath. 07/13/21   Alfonse Spruce, MD  butalbital-acetaminophen-caffeine (FIORICET) [phone] MG tablet Take 1 tablet by mouth daily as needed. 02/13/21   Wilson Singer, MD  Cholecalciferol (VITAMIN D3) 250 MCG (10000 UT) TABS Take 1 capsule by mouth daily.    [provider]  clonazePAM (KLONOPIN) 0.5 MG tablet Take 1 tablet (0.5 mg total) by mouth daily as needed. 10/19/20   Wilson Singer, MD  EPINEPHrine (AUVI-Q) 0.3 mg/0.3 mL IJ SOAJ injection Inject 0.3 mLs (0.3 mg total) into the muscle as needed for anaphylaxis. 02/05/20   Alfonse Spruce, MD  estradiol (ESTRACE) 2 MG tablet Take 1 tablet (2 mg total) by mouth daily. 05/26/21   Shade Flood, MD  fexofenadine (ALLEGRA) 180 MG tablet Take 180 mg by mouth daily. Now taking daily per allergist.    [provider]  ibuprofen (ADVIL,MOTRIN) 800 MG tablet Take 1 tablet (800 mg total) by mouth 3 (three) times daily. 01/15/16   Eber Hong, MD  Misc Natural Products (NF FORMULAS TESTOSTERONE) CAPS 1 capsule daily by mouth 04/25/21   Wilson Singer, MD  NP THYROID 30 MG tablet Take 1 tablet (30 mg total) by mouth every evening. 05/26/21 06/25/21  Shade Flood, MD  NP THYROID 60 MG tablet Take 1 tablet (60 mg total) by mouth daily before breakfast. 05/26/21   Shade Flood, MD  progesterone (PROMETRIUM) 100 MG capsule Take 3 capsules (300 mg total) by mouth at bedtime. 03/16/21   Wilson Singer, MD  tiZANidine (ZANAFLEX) 2 MG tablet Take 1 tablet (2 mg total) by mouth every evening. 05/26/21   Shade Flood, MD  triamcinolone (NASACORT) 55 MCG/ACT AERO nasal inhaler Place 1 spray into the nose daily. 12/19/18   Alfonse Spruce, MD    Family History Family History  Problem Relation Age of Onset   Graves' disease Mother    Fibromyalgia Mother    Allergic rhinitis Father    Cancer  Father    Diabetes Father    Breast cancer Neg Hx    Angioedema Neg Hx    Asthma Neg Hx    Atopy Neg Hx    Eczema Neg Hx    Immunodeficiency Neg Hx    Urticaria Neg Hx     Social History Social History   Tobacco Use   Smoking status: Never   Smokeless tobacco: Never  Vaping Use   Vaping Use: Never used  Substance Use Topics   Alcohol use: Yes    Alcohol/week: 7.0 standard drinks    Types: 7 Glasses of wine per week    Comment: sometimes less   Drug use: No     Allergies   Codeine, Cefdinir, and Diphenhydramine hcl   Review of Systems Review of Systems  Constitutional: Negative.  Negative for chills and fever.  HENT:  Positive for congestion and postnasal drip. Negative for sore throat.   Respiratory:  Positive for cough, chest tightness and wheezing. Negative for shortness of breath.   Gastrointestinal: Negative.   Neurological: Negative.     Physical Exam Triage Vital Signs ED Triage Vitals  Enc Vitals Group     BP 09/04/21 0905 117/71     Pulse Rate 09/04/21 0905 74     Resp 09/04/21 0905 16     Temp 09/04/21 0905 98.7 F (37.1 C)     Temp Source 09/04/21 0905 Oral     SpO2 09/04/21 0905 95 %     Weight --      Height --      Head Circumference --      Peak Flow --      Pain Score 09/04/21 0902 6     Pain Loc --      Pain Edu? --      Excl. in GC? --    No data found.  Updated Vital Signs BP 117/71   Pulse 74   Temp 98.7 F (37.1 C) (Oral)   Resp 16   SpO2 95%   Visual Acuity Right Eye Distance:   Left Eye Distance:   Bilateral Distance:    Right Eye Near:   Left Eye Near:    Bilateral Near:     Physical Exam Vitals and nursing note reviewed.  Constitutional:      General: She is not in acute distress.    Appearance: She is not ill-appearing.  HENT:     Right Ear: Tympanic membrane normal.     Left Ear: Tympanic membrane normal.     Mouth/Throat:     Pharynx: No posterior oropharyngeal erythema.  Cardiovascular:     Rate and  Rhythm: Normal rate and regular rhythm.     Pulses: Normal pulses.  Heart sounds: Normal heart sounds.  Pulmonary:     Effort: Pulmonary effort is normal.     Breath sounds: Normal breath sounds.  Abdominal:     General: Bowel sounds are normal.     Palpations: Abdomen is soft.  Musculoskeletal:     Cervical back: Normal range of motion.  Neurological:     Mental Status: She is alert.     UC Treatments / Results  Labs (all labs ordered are listed, but only abnormal results are displayed) Labs Reviewed - No data to display  EKG   Radiology No results found.  Procedures Procedures (including critical care time)  Medications Ordered in UC Medications - No data to display  Initial Impression / Assessment and Plan / UC Course  I have reviewed the triage vital signs and the nursing notes.  Pertinent labs & imaging results that were available during my care of the patient were reviewed by me and considered in my medical decision making (see chart for details).     1.  Mild intermittent asthma with acute exacerbation: Prednisone 20 mg orally daily for 5 days Continue albuterol inhaler use Pratropium nasal spray use in addition to Nasacort use No indication for chest x-ray since your lung exam is unimpressive Return to urgent care if symptoms worsen.  Final Clinical Impressions(s) / UC Diagnoses   Final diagnoses:  Mild intermittent asthma with (acute) exacerbation     Discharge Instructions      Please take medications as prescribed Use albuterol inhaler as needed No indication for COVID or flu testing given the duration of symptoms If symptoms worsen please return to urgent care to be reevaluated.     ED Prescriptions     Medication Sig Dispense Auth. Provider   ipratropium (ATROVENT) 0.03 % nasal spray Place 2 sprays into both nostrils every 12 (twelve) hours. 30 mL Alphonzo Devera, Britta Mccreedy, MD   benzonatate (TESSALON) 100 MG capsule Take 1 capsule (100 mg  total) by mouth 3 (three) times daily as needed for cough. 21 capsule Kenneth Lax, Britta Mccreedy, MD   predniSONE (DELTASONE) 20 MG tablet Take 1 tablet (20 mg total) by mouth daily for 5 days. 5 tablet Sira Adsit, Britta Mccreedy, MD      PDMP not reviewed this encounter.   Merrilee Jansky, MD 09/04/21 [phone]

## 2021-09-11 ENCOUNTER — Ambulatory Visit (INDEPENDENT_AMBULATORY_CARE_PROVIDER_SITE_OTHER): Payer: BC Managed Care – PPO | Admitting: Family Medicine

## 2021-09-11 ENCOUNTER — Encounter: Payer: Self-pay | Admitting: Family Medicine

## 2021-09-11 ENCOUNTER — Other Ambulatory Visit: Payer: Self-pay

## 2021-09-11 VITALS — BP 122/58 | HR 72 | Temp 98.3°F | Resp 16 | Ht 61.0 in | Wt 119.0 lb

## 2021-09-11 DIAGNOSIS — J3089 Other allergic rhinitis: Secondary | ICD-10-CM

## 2021-09-11 DIAGNOSIS — K219 Gastro-esophageal reflux disease without esophagitis: Secondary | ICD-10-CM | POA: Insufficient documentation

## 2021-09-11 DIAGNOSIS — J302 Other seasonal allergic rhinitis: Secondary | ICD-10-CM

## 2021-09-11 DIAGNOSIS — J4521 Mild intermittent asthma with (acute) exacerbation: Secondary | ICD-10-CM | POA: Insufficient documentation

## 2021-09-11 DIAGNOSIS — H6983 Other specified disorders of Eustachian tube, bilateral: Secondary | ICD-10-CM | POA: Diagnosis not present

## 2021-09-11 MED ORDER — AIRDUO DIGIHALER 232-14 MCG/ACT IN AEPB: 1.0000 | INHALATION_SPRAY | Freq: Two times a day (BID) | RESPIRATORY_TRACT | 5 refills | Status: DC

## 2021-09-11 NOTE — Patient Instructions (Addendum)
Asthma Begin Air Duo 232-1 puff twice a day to prevent cough or wheeze Continue albuterol 2 puffs once every 4 hours as needed for cough or wheeze You may use albuterol 2 puffs 5 to 15 minutes before activity to decrease cough or wheeze  Allergic rhinitis Continue allergen avoidance measures directed toward pollens, mold, dust mite, and pets as listed below Continue allergen immunotherapy and have access to an epinephrine autoinjector.  Do not receive your allergy injection until your breathing has returned to baseline Continue Allegra 180 mg once a day as needed for runny nose or itch Continue ipratropium nasal spray 2 sprays in each nostril twice a day as needed for runny nose Begin Ryaltris 2 sprays in each nostril twice a day for nasal symptoms. When this sample is gone, then continue Nasacort 1 to 2 sprays in each nostril once a day as needed for stuffy nose.  In the right nostril, point the applicator out toward the right ear. In the left nostril, point the applicator out toward the left ear Begin saline nasal rinses as needed for nasal symptoms. Use this before any medicated nasal sprays for best result  Eustachian tube dysfunction Continue daily nasal saline rinses as well as nasal steroid sprays  Reflux Continue dietary and lifestyle modifications as listed below  Call the clinic if this treatment plan is not working well for you.  Follow up in 4 weeks or sooner if needed.   Lifestyle Changes for Controlling GERD When you have GERD, stomach acid feels as if it's backing up toward your mouth. Whether or not you take medication to control your GERD, your symptoms can often be improved with lifestyle changes.   Raise Your Head Reflux is more likely to strike when you're lying down flat, because stomach fluid can flow backward more easily. Raising the head of your bed 4-6 inches can help. To do this: Slide blocks or books under the legs at the head of your bed. Or, place a wedge  under the mattress. Many foam stores can make a suitable wedge for you. The wedge should run from your waist to the top of your head. Don't just prop your head on several pillows. This increases pressure on your stomach. It can make GERD worse.  Watch Your Eating Habits Certain foods may increase the acid in your stomach or relax the lower esophageal sphincter, making GERD more likely. It's best to avoid the following: Coffee, tea, and carbonated drinks (with and without caffeine) Fatty, fried, or spicy food Mint, chocolate, onions, and tomatoes Any other foods that seem to irritate your stomach or cause you pain  Relieve the Pressure Eat smaller meals, even if you have to eat more often. Don't lie down right after you eat. Wait a few hours for your stomach to empty. Avoid tight belts and tight-fitting clothes. Lose excess weight.  Tobacco and Alcohol Avoid smoking tobacco and drinking alcohol. They can make GERD symptoms worse.  Reducing Pollen Exposure The American Academy of Allergy, Asthma and Immunology suggests the following steps to reduce your exposure to pollen during allergy seasons. Do not hang sheets or clothing out to dry; pollen may collect on these items. Do not mow lawns or spend time around freshly cut grass; mowing stirs up pollen. Keep windows closed at night.  Keep car windows closed while driving. Minimize morning activities outdoors, a time when pollen counts are usually at their highest. Stay indoors as much as possible when pollen counts or humidity is high  and on windy days when pollen tends to remain in the air longer. Use air conditioning when possible.  Many air conditioners have filters that trap the pollen spores. Use a HEPA room air filter to remove pollen form the indoor air you breathe.  Control of Mold Allergen Mold and fungi can grow on a variety of surfaces provided certain temperature and moisture conditions exist.  Outdoor molds grow on plants,  decaying vegetation and soil.  The major outdoor mold, Alternaria and Cladosporium, are found in very high numbers during hot and dry conditions.  Generally, a late Summer - Fall peak is seen for common outdoor fungal spores.  Rain will temporarily lower outdoor mold spore count, but counts rise rapidly when the rainy period ends.  The most important indoor molds are Aspergillus and Penicillium.  Dark, humid and poorly ventilated basements are ideal sites for mold growth.  The next most common sites of mold growth are the bathroom and the kitchen.  Outdoor [employer] Use air conditioning and keep windows closed Avoid exposure to decaying vegetation. Avoid leaf raking. Avoid grain handling. Consider wearing a face mask if working in moldy areas.  Indoor Mold Control Maintain humidity below 50%. Clean washable surfaces with 5% bleach solution. Remove sources e.g. Contaminated carpets.   Control of Dust Mite Allergen Dust mites play a major role in allergic asthma and rhinitis. They occur in environments with high humidity wherever human skin is found. Dust mites absorb humidity from the atmosphere (ie, they do not drink) and feed on organic matter (including shed human and animal skin). Dust mites are a microscopic type of insect that you cannot see with the naked eye. High levels of dust mites have been detected from mattresses, pillows, carpets, upholstered furniture, bed covers, clothes, soft toys and any woven material. The principal allergen of the dust mite is found in its feces. A gram of dust may contain 1,000 mites and 250,000 fecal particles. Mite antigen is easily measured in the air during house cleaning activities. Dust mites do not bite and do not cause harm to humans, other than by triggering allergies/asthma.  Ways to decrease your exposure to dust mites in your home:  1. Encase mattresses, box springs and pillows with a mite-impermeable barrier or cover  2. Wash sheets,  blankets and drapes weekly in hot water (130 F) with detergent and dry them in a dryer on the hot setting.  3. Have the room cleaned frequently with a vacuum cleaner and a damp dust-mop. For carpeting or rugs, vacuuming with a vacuum cleaner equipped with a high-efficiency particulate air (HEPA) filter. The dust mite allergic individual should not be in a room which is being cleaned and should wait 1 hour after cleaning before going into the room.  4. Do not sleep on upholstered furniture (eg, couches).  5. If possible removing carpeting, upholstered furniture and drapery from the home is ideal. Horizontal blinds should be eliminated in the rooms where the person spends the most time (bedroom, study, television room). Washable vinyl, roller-type shades are optimal.  6. Remove all non-washable stuffed toys from the bedroom. Wash stuffed toys weekly like sheets and blankets above.  7. Reduce indoor humidity to less than 50%. Inexpensive humidity monitors can be purchased at most hardware stores. Do not use a humidifier as can make the problem worse and are not recommended.  Control of Dog or Cat Allergen Avoidance is the best way to manage a dog or cat allergy. If you have  a dog or cat and are allergic to dog or cats, consider removing the dog or cat from the home. If you have a dog or cat but don't want to find it a new home, or if your family wants a pet even though someone in the household is allergic, here are some strategies that may help keep symptoms at bay:  Keep the pet out of your bedroom and restrict it to only a few rooms. Be advised that keeping the dog or cat in only one room will not limit the allergens to that room. Don't pet, hug or kiss the dog or cat; if you do, wash your hands with soap and water. High-efficiency particulate air (HEPA) cleaners run continuously in a bedroom or living room can reduce allergen levels over time. Regular use of a high-efficiency vacuum cleaner or a  central vacuum can reduce allergen levels. Giving your dog or cat a bath at least once a week can reduce airborne allergen.

## 2021-09-11 NOTE — Addendum Note (Signed)
Addended by: Dollene Cleveland R on: 09/11/2021 05:10 PM   Modules accepted: Orders

## 2021-09-11 NOTE — Progress Notes (Signed)
[address] [place] [address] Dept: [phone]  FOLLOW UP NOTE  Patient ID: Sabrina Clark, female    DOB: 1956-03-17  Age: 65 y.o. MRN: [id number] Date of Office Visit: 09/11/2021  Assessment  Chief Complaint: Other (Tightness in chest, cough - went to urgent care exactly a week ago, patient started taking prednisone, and ipratropium nasal spray. Patient thinks her symptoms are from allergies, she started cleaning her barn and said she did not wear a mask and it was very dusty. Used inhaler 3x daily for the past week and a half and it has not improved symptoms.)  HPI Sabrina Clark is a 65 year old female who presents the clinic for follow-up visit.  She was last seen in this clinic on 07/13/2021 by Dr. Dellis Anes for evaluation of asthma and allergic rhinitis on allergen immunotherapy.    At today's visit, she reports her asthma has been poorly controlled with symptoms starting about 2 weeks ago.  She reports that she was clearing her barn and worked with [employer] with no mask after which she reports symptoms began including shortness of breath, chest tightness, and dry cough.  She reports that she began using her albuterol inhaler 2-3 times a day for several days with no relief of symptoms. She presented to urgent care in [place] on 09/04/2021 with symptoms consistent with asthma exacerbation for which she received a prednisone taper and ipratropium nasal spray.  She took the prednisone given at the urgent care visit as well as some stronger prednisone that she had leftover at her house with moderate relief of symptoms.  She reports her symptoms today as chest tightness, cough at night which is mostly dry and occasionally produces clear mucus, and occasional shortness of breath.  She continues albuterol several times a day with no relief of symptoms.  Allergic rhinitis is reported as moderately well controlled with no rhinorrhea, nasal congestion, or sneezing.  She does  report copious postnasal drainage with frequent throat clearing for the last 2 weeks.  She does report some lightheadedness over the last week which is beginning to improve slightly.  Reflux is reported as well controlled with no symptoms including heartburn or vomiting.  She is not taking a medication to control reflux at this time.  She denies fever and sick contacts.  Her current medications are listed in the chart.  Drug Allergies:  Allergies  Allergen Reactions   Codeine Hives    Itch     Cefdinir    Diphenhydramine Hcl Hives    Physical Exam: BP (!) 122/58   Pulse 72   Temp 98.3 F (36.8 C) (Temporal)   Resp 16   Ht 5\' 1"  (1.549 m)   Wt 119 lb (54 kg)   SpO2 98%   BMI 22.48 kg/m    Physical Exam Vitals reviewed.  Constitutional:      Appearance: Normal appearance.  HENT:     Head: Normocephalic and atraumatic.     Ears:     Comments: Bilateral tympanic membrane retracted    Nose:     Comments: Bilateral nares slightly erythematous with clear nasal drainage noted.  Pharynx erythematous with no exudate.  Ears normal.  Eyes normal. Eyes:     Conjunctiva/sclera: Conjunctivae normal.  Cardiovascular:     Rate and Rhythm: Normal rate and regular rhythm.     Heart sounds: Normal heart sounds. No murmur heard. Pulmonary:     Effort: Pulmonary effort is normal.     Breath sounds:  Normal breath sounds.     Comments: Lungs clear to auscultation Musculoskeletal:        General: Normal range of motion.     Cervical back: Normal range of motion and neck supple.  Skin:    General: Skin is warm and dry.  Neurological:     Mental Status: She is alert and oriented to person, place, and time.  Psychiatric:        Mood and Affect: Mood normal.        Behavior: Behavior normal.        Thought Content: Thought content normal.        Judgment: Judgment normal.    Diagnostics: FVC 2.29, FEV1 1.77.  Predicted FVC 2.73, predicted FEV1 2.16.  Spirometry indicates normal  ventilatory function.  Assessment and Plan: 1. Seasonal and perennial allergic rhinitis   2. Mild intermittent asthma with acute exacerbation   3. Gastroesophageal reflux disease, unspecified whether esophagitis present   4. Dysfunction of both eustachian tubes     Meds ordered this encounter  Medications   AIRDUO DIGIHALER 232-14 MCG/ACT AEPB    Sig: Inhale 1 puff into the lungs 2 (two) times daily.    Dispense:  1 each    Refill:  5    Please use attached coupon, ID [id number], GRP [id number], BIN [id number].     Patient Instructions  Asthma Begin Air Duo 232-1 puff twice a day to prevent cough or wheeze Continue albuterol 2 puffs once every 4 hours as needed for cough or wheeze You may use albuterol 2 puffs 5 to 15 minutes before activity to decrease cough or wheeze  Allergic rhinitis Continue allergen avoidance measures directed toward pollens, mold, dust mite, and pets as listed below Continue allergen immunotherapy and have access to an epinephrine autoinjector.  Do not receive your allergy injection until your breathing has returned to baseline Continue Allegra 180 mg once a day as needed for runny nose or itch Continue ipratropium nasal spray 2 sprays in each nostril twice a day as needed for runny nose Begin Ryaltris 2 sprays in each nostril twice a day for nasal symptoms. When this sample is gone, then continue Nasacort 1 to 2 sprays in each nostril once a day as needed for stuffy nose.  In the right nostril, point the applicator out toward the right ear. In the left nostril, point the applicator out toward the left ear Begin saline nasal rinses as needed for nasal symptoms. Use this before any medicated nasal sprays for best result  Eustachian tube dysfunction Continue daily nasal saline rinses as well as nasal steroid sprays  Reflux Continue dietary and lifestyle modifications as listed below  Call the clinic if this treatment plan is not working well for you.  Follow  up in 4 weeks or sooner if needed.   Return in about 4 weeks (around 10/09/2021), or if symptoms worsen or fail to improve.    Thank you for the opportunity to care for this patient.  Please do not hesitate to contact me with questions.  Thermon Leyland, FNP [facility]

## 2021-09-12 ENCOUNTER — Encounter (INDEPENDENT_AMBULATORY_CARE_PROVIDER_SITE_OTHER): Payer: BC Managed Care – PPO | Admitting: Internal Medicine

## 2021-09-14 DIAGNOSIS — R531 Weakness: Secondary | ICD-10-CM | POA: Diagnosis not present

## 2021-09-14 DIAGNOSIS — M25511 Pain in right shoulder: Secondary | ICD-10-CM | POA: Diagnosis not present

## 2021-09-14 DIAGNOSIS — M25551 Pain in right hip: Secondary | ICD-10-CM | POA: Diagnosis not present

## 2021-09-18 ENCOUNTER — Other Ambulatory Visit: Payer: Self-pay

## 2021-09-18 ENCOUNTER — Encounter: Payer: Self-pay | Admitting: Emergency Medicine

## 2021-09-18 ENCOUNTER — Ambulatory Visit (INDEPENDENT_AMBULATORY_CARE_PROVIDER_SITE_OTHER): Payer: BC Managed Care – PPO

## 2021-09-18 ENCOUNTER — Ambulatory Visit
Admission: EM | Admit: 2021-09-18 | Discharge: 2021-09-18 | Disposition: A | Discharge status: Home / Self Care | Payer: BC Managed Care – PPO

## 2021-09-18 DIAGNOSIS — J209 Acute bronchitis, unspecified: Secondary | ICD-10-CM

## 2021-09-18 DIAGNOSIS — R059 Cough, unspecified: Secondary | ICD-10-CM

## 2021-09-18 DIAGNOSIS — R519 Headache, unspecified: Secondary | ICD-10-CM | POA: Diagnosis not present

## 2021-09-18 MED ORDER — PREDNISONE 50 MG PO TABS: ORAL_TABLET | ORAL | 0 refills | Status: DC

## 2021-09-18 MED ORDER — DEXAMETHASONE SODIUM PHOSPHATE 10 MG/ML IJ SOLN
10.0000 mg | Freq: Once | INTRAMUSCULAR | Status: AC
  Administered 2021-09-18: 10 mg via INTRAMUSCULAR

## 2021-09-18 MED ORDER — DOXYCYCLINE HYCLATE 100 MG PO CAPS: 100.0000 mg | ORAL_CAPSULE | Freq: Two times a day (BID) | ORAL | 0 refills | Status: DC

## 2021-09-18 NOTE — ED Triage Notes (Addendum)
Pt reports seen for same a few weeks ago. Pt reports also followed up with allergist and was given new inhaler and nasal spray. Pt reports cough,headache.Pt requested covid test. Swab collected in triage.

## 2021-09-18 NOTE — Discharge Instructions (Addendum)
Follow up with your Physician for recheck  

## 2021-09-19 ENCOUNTER — Ambulatory Visit: Payer: Self-pay

## 2021-09-19 LAB — COVID-19, FLU A+B NAA
Influenza A, NAA: NOT DETECTED
Influenza B, NAA: NOT DETECTED
SARS-CoV-2, NAA: NOT DETECTED

## 2021-09-23 NOTE — ED Provider Notes (Signed)
[facility]    CSN: [id number] Arrival date & time: 09/18/21  0905      History   Chief Complaint Chief Complaint  Patient presents with   Cough    HPI Sabrina Clark is a 65 y.o. female.   The history is provided by the patient. No language interpreter was used.  Cough Cough characteristics:  Non-productive Sputum characteristics:  Nondescript Severity:  Moderate Onset quality:  Gradual Duration:  2 weeks Timing:  Constant Progression:  Worsening Chronicity:  New Smoker: no   Relieved by:  Nothing Worsened by:  Nothing Associated symptoms: no fever    Past Medical History:  Diagnosis Date   Asthma    Female hypogonadism syndrome    Fibromyalgia 07/01/2019   Malaise and fatigue    Vitamin D deficiency disease     Patient Active Problem List   Diagnosis Date Noted   Gastroesophageal reflux disease 09/11/2021   Dysfunction of both eustachian tubes 09/11/2021   Mild intermittent asthma with acute exacerbation 09/11/2021   Fibromyalgia 07/01/2019   Vitamin D deficiency disease    Female hypogonadism syndrome    Asthma    Malaise and fatigue    Seasonal and perennial allergic rhinitis 12/19/2018   Moderate persistent asthma, uncomplicated 12/19/2018    Past Surgical History:  Procedure Laterality Date   BREAST BIOPSY Left 2019   x3   BREAST CYST ASPIRATION     CYSTECTOMY      OB History   No obstetric history on file.      Home Medications    Prior to Admission medications   Medication Sig Start Date End Date Taking? Authorizing Provider  doxycycline (VIBRAMYCIN) 100 MG capsule Take 1 capsule (100 mg total) by mouth 2 (two) times daily. 09/18/21  Yes Cheron Schaumann K, PA-C  loratadine-pseudoephedrine (CLARITIN-D 12-HOUR) 5-120 MG tablet Take 1 tablet by mouth 2 (two) times daily.   Yes [provider]  predniSONE (DELTASONE) 50 MG tablet One tablet a day 09/18/21  Yes Osie Cheeks  AIRDUO DIGIHALER 232-14 MCG/ACT AEPB  Inhale 1 puff into the lungs 2 (two) times daily. 09/11/21   Hetty Blend, FNP  albuterol (VENTOLIN HFA) 108 (90 Base) MCG/ACT inhaler Inhale 2 puffs into the lungs every 4 (four) hours as needed for wheezing or shortness of breath. 07/13/21   Alfonse Spruce, MD  benzonatate (TESSALON) 100 MG capsule Take 1 capsule (100 mg total) by mouth 3 (three) times daily as needed for cough. Patient not taking: Reported on 09/11/2021 09/04/21   Merrilee Jansky, MD  butalbital-acetaminophen-caffeine (FIORICET) [phone] MG tablet Take 1 tablet by mouth daily as needed. 02/13/21   Wilson Singer, MD  Cholecalciferol (VITAMIN D3) 250 MCG (10000 UT) TABS Take 1 capsule by mouth daily.    [provider]  clonazePAM (KLONOPIN) 0.5 MG tablet Take 1 tablet (0.5 mg total) by mouth daily as needed. 10/19/20   Wilson Singer, MD  EPINEPHrine (AUVI-Q) 0.3 mg/0.3 mL IJ SOAJ injection Inject 0.3 mLs (0.3 mg total) into the muscle as needed for anaphylaxis. 02/05/20   Alfonse Spruce, MD  estradiol (ESTRACE) 2 MG tablet Take 1 tablet (2 mg total) by mouth daily. 05/26/21   Shade Flood, MD  fexofenadine (ALLEGRA) 180 MG tablet Take 180 mg by mouth daily. Now taking daily per allergist. Patient not taking: Reported on 09/18/2021    [provider]  ibuprofen (ADVIL,MOTRIN) 800 MG tablet Take 1 tablet (800 mg  total) by mouth 3 (three) times daily. 01/15/16   Eber Hong, MD  ipratropium (ATROVENT) 0.03 % nasal spray Place 2 sprays into both nostrils every 12 (twelve) hours. 09/04/21   Lamptey, Britta Mccreedy, MD  Misc Natural Products (NF FORMULAS TESTOSTERONE) CAPS 1 capsule daily by mouth 04/25/21   Wilson Singer, MD  NP THYROID 30 MG tablet Take 1 tablet (30 mg total) by mouth every evening. 05/26/21 06/25/21  Shade Flood, MD  NP THYROID 60 MG tablet Take 1 tablet (60 mg total) by mouth daily before breakfast. 05/26/21   Shade Flood, MD  progesterone (PROMETRIUM) 100 MG capsule Take  3 capsules (300 mg total) by mouth at bedtime. 03/16/21   Wilson Singer, MD  tiZANidine (ZANAFLEX) 2 MG tablet Take 1 tablet (2 mg total) by mouth every evening. 05/26/21   Shade Flood, MD  triamcinolone (NASACORT) 55 MCG/ACT AERO nasal inhaler Place 1 spray into the nose daily. 12/19/18   Alfonse Spruce, MD    Family History Family History  Problem Relation Age of Onset   Graves' disease Mother    Fibromyalgia Mother    Allergic rhinitis Father    Cancer Father    Diabetes Father    Breast cancer Neg Hx    Angioedema Neg Hx    Asthma Neg Hx    Atopy Neg Hx    Eczema Neg Hx    Immunodeficiency Neg Hx    Urticaria Neg Hx     Social History Social History   Tobacco Use   Smoking status: Never   Smokeless tobacco: Never  Vaping Use   Vaping Use: Never used  Substance Use Topics   Alcohol use: Yes    Alcohol/week: 7.0 standard drinks    Types: 7 Glasses of wine per week    Comment: sometimes less   Drug use: No     Allergies   Codeine, Cefdinir, and Diphenhydramine hcl   Review of Systems Review of Systems  Constitutional:  Negative for fever.  Respiratory:  Positive for cough.   All other systems reviewed and are negative.   Physical Exam Triage Vital Signs ED Triage Vitals  Enc Vitals Group     BP --      Pulse --      Resp --      Temp --      Temp src --      SpO2 --      Weight 09/18/21 1004 115 lb (52.2 kg)     Height 09/18/21 1004 5' 1.5" (1.562 m)     Head Circumference --      Peak Flow --      Pain Score 09/18/21 1002 0     Pain Loc --      Pain Edu? --      Excl. in GC? --    No data found.  Updated Vital Signs Ht 5' 1.5" (1.562 m)   Wt 52.2 kg   BMI 21.38 kg/m   Visual Acuity Right Eye Distance:   Left Eye Distance:   Bilateral Distance:    Right Eye Near:   Left Eye Near:    Bilateral Near:     Physical Exam Vitals and nursing note reviewed.  Constitutional:      Appearance: She is well-developed.  HENT:      Head: Normocephalic.  Cardiovascular:     Rate and Rhythm: Normal rate.  Pulmonary:     Effort: Pulmonary effort  is normal.  Abdominal:     General: There is no distension.  Musculoskeletal:        General: Normal range of motion.     Cervical back: Normal range of motion.  Neurological:     Mental Status: She is alert and oriented to person, place, and time.     UC Treatments / Results  Labs (all labs ordered are listed, but only abnormal results are displayed) Labs Reviewed  COVID-19, FLU A+B NAA   Narrative:    Performed at:  [address], [place], [place]  [id number] Lab Director: Jolene Schimke MD, Phone:  [phone]    EKG   Radiology No results found.  Procedures Procedures (including critical care time)  Medications Ordered in UC Medications  dexamethasone (DECADRON) injection 10 mg (10 mg Intramuscular Given 09/18/21 1204)    Initial Impression / Assessment and Plan / UC Course  I have reviewed the triage vital signs and the nursing notes.  Pertinent labs & imaging results that were available during my care of the patient were reviewed by me and considered in my medical decision making (see chart for details).      Final Clinical Impressions(s) / UC Diagnoses   Final diagnoses:  Cough, unspecified type  Acute bronchitis, unspecified organism  An After Visit Summary was printed and given to the patient.    Discharge Instructions      Follow up with your Physician for recheck    ED Prescriptions     Medication Sig Dispense Auth. Provider   predniSONE (DELTASONE) 50 MG tablet One tablet a day 6 tablet Leontina Skidmore K, PA-C   doxycycline (VIBRAMYCIN) 100 MG capsule Take 1 capsule (100 mg total) by mouth 2 (two) times daily. 20 capsule Elson Areas, [place]      PDMP not reviewed this encounter.   Elson Areas, [place] 09/23/21 1529

## 2021-09-25 ENCOUNTER — Ambulatory Visit (INDEPENDENT_AMBULATORY_CARE_PROVIDER_SITE_OTHER): Payer: BC Managed Care – PPO | Admitting: Family Medicine

## 2021-09-25 ENCOUNTER — Encounter: Payer: Self-pay | Admitting: Family Medicine

## 2021-09-25 ENCOUNTER — Telehealth: Payer: Self-pay | Admitting: *Deleted

## 2021-09-25 ENCOUNTER — Other Ambulatory Visit: Payer: Self-pay

## 2021-09-25 VITALS — BP 124/64 | HR 81 | Temp 97.3°F | Resp 16

## 2021-09-25 DIAGNOSIS — H6983 Other specified disorders of Eustachian tube, bilateral: Secondary | ICD-10-CM | POA: Diagnosis not present

## 2021-09-25 DIAGNOSIS — H6993 Unspecified Eustachian tube disorder, bilateral: Secondary | ICD-10-CM

## 2021-09-25 DIAGNOSIS — R432 Parageusia: Secondary | ICD-10-CM | POA: Insufficient documentation

## 2021-09-25 DIAGNOSIS — J302 Other seasonal allergic rhinitis: Secondary | ICD-10-CM

## 2021-09-25 DIAGNOSIS — J4540 Moderate persistent asthma, uncomplicated: Secondary | ICD-10-CM

## 2021-09-25 DIAGNOSIS — K219 Gastro-esophageal reflux disease without esophagitis: Secondary | ICD-10-CM | POA: Diagnosis not present

## 2021-09-25 DIAGNOSIS — J3089 Other allergic rhinitis: Secondary | ICD-10-CM | POA: Diagnosis not present

## 2021-09-25 DIAGNOSIS — R430 Anosmia: Secondary | ICD-10-CM

## 2021-09-25 MED ORDER — RYALTRIS 665-25 MCG/ACT NA SUSP: 2.0000 | Freq: Two times a day (BID) | NASAL | 5 refills | Status: DC

## 2021-09-25 MED ORDER — EPINEPHRINE 0.3 MG/0.3ML IJ SOAJ: 0.3000 mg | INTRAMUSCULAR | 2 refills | Status: DC | PRN

## 2021-09-25 MED ORDER — IPRATROPIUM BROMIDE 0.03 % NA SOLN: 2.0000 | Freq: Two times a day (BID) | NASAL | 5 refills | Status: DC

## 2021-09-25 MED ORDER — TRELEGY ELLIPTA 200-62.5-25 MCG/ACT IN AEPB: 1.0000 | INHALATION_SPRAY | Freq: Every day | RESPIRATORY_TRACT | 5 refills | Status: DC

## 2021-09-25 NOTE — Patient Instructions (Addendum)
Asthma Begin Trelegy 1 puff once a day to prevent cough or wheeze Continue albuterol 2 puffs once every 4 hours as needed for cough or wheeze You may use albuterol 2 puffs 5 to 15 minutes before activity to decrease cough or wheeze  Allergic rhinitis Stop Allegra for the next week, then continue Allegra 180 mg once a day as needed for runny nose or itch Begin Mucinex [phone] mg twice a day and increase fluid intake to thin mucus secretions Restart Ryaltris 2 sprays in each nostril twice a day for nasal symptoms.  Begin saline nasal rinses as needed for nasal symptoms. Use this before any medicated nasal sprays for best result Continue ipratropium nasal spray 2 sprays in each nostril twice a day as needed for runny nose Continue allergen avoidance measures directed toward pollens, mold, dust mite, and pets as listed below Continue allergen immunotherapy and have access to an epinephrine autoinjector.  Do not receive your allergy injection until your breathing has returned to baseline  Eustachian tube dysfunction Continue daily nasal saline rinses as well as nasal steroid sprays  Reflux Continue dietary and lifestyle modifications as listed below  Continue doxycycline provided by urgent care until the end of the course of this medication  Call the clinic if this treatment plan is not working well for you.  Follow up in 4 weeks or sooner if needed.   Lifestyle Changes for Controlling GERD When you have GERD, stomach acid feels as if it's backing up toward your mouth. Whether or not you take medication to control your GERD, your symptoms can often be improved with lifestyle changes.   Raise Your Head Reflux is more likely to strike when you're lying down flat, because stomach fluid can flow backward more easily. Raising the head of your bed 4-6 inches can help. To do this: Slide blocks or books under the legs at the head of your bed. Or, place a wedge under the mattress. Many foam  stores can make a suitable wedge for you. The wedge should run from your waist to the top of your head. Don't just prop your head on several pillows. This increases pressure on your stomach. It can make GERD worse.  Watch Your Eating Habits Certain foods may increase the acid in your stomach or relax the lower esophageal sphincter, making GERD more likely. It's best to avoid the following: Coffee, tea, and carbonated drinks (with and without caffeine) Fatty, fried, or spicy food Mint, chocolate, onions, and tomatoes Any other foods that seem to irritate your stomach or cause you pain  Relieve the Pressure Eat smaller meals, even if you have to eat more often. Don't lie down right after you eat. Wait a few hours for your stomach to empty. Avoid tight belts and tight-fitting clothes. Lose excess weight.  Tobacco and Alcohol Avoid smoking tobacco and drinking alcohol. They can make GERD symptoms worse.  Reducing Pollen Exposure The American Academy of Allergy, Asthma and Immunology suggests the following steps to reduce your exposure to pollen during allergy seasons. Do not hang sheets or clothing out to dry; pollen may collect on these items. Do not mow lawns or spend time around freshly cut grass; mowing stirs up pollen. Keep windows closed at night.  Keep car windows closed while driving. Minimize morning activities outdoors, a time when pollen counts are usually at their highest. Stay indoors as much as possible when pollen counts or humidity is high and on windy days when pollen tends to remain in  the air longer. Use air conditioning when possible.  Many air conditioners have filters that trap the pollen spores. Use a HEPA room air filter to remove pollen form the indoor air you breathe.  Control of Mold Allergen Mold and fungi can grow on a variety of surfaces provided certain temperature and moisture conditions exist.  Outdoor molds grow on plants, decaying vegetation and soil.   The major outdoor mold, Alternaria and Cladosporium, are found in very high numbers during hot and dry conditions.  Generally, a late Summer - Fall peak is seen for common outdoor fungal spores.  Rain will temporarily lower outdoor mold spore count, but counts rise rapidly when the rainy period ends.  The most important indoor molds are Aspergillus and Penicillium.  Dark, humid and poorly ventilated basements are ideal sites for mold growth.  The next most common sites of mold growth are the bathroom and the kitchen.  Outdoor [employer] Use air conditioning and keep windows closed Avoid exposure to decaying vegetation. Avoid leaf raking. Avoid grain handling. Consider wearing a face mask if working in moldy areas.  Indoor Mold Control Maintain humidity below 50%. Clean washable surfaces with 5% bleach solution. Remove sources e.g. Contaminated carpets.   Control of Dust Mite Allergen Dust mites play a major role in allergic asthma and rhinitis. They occur in environments with high humidity wherever human skin is found. Dust mites absorb humidity from the atmosphere (ie, they do not drink) and feed on organic matter (including shed human and animal skin). Dust mites are a microscopic type of insect that you cannot see with the naked eye. High levels of dust mites have been detected from mattresses, pillows, carpets, upholstered furniture, bed covers, clothes, soft toys and any woven material. The principal allergen of the dust mite is found in its feces. A gram of dust may contain 1,000 mites and 250,000 fecal particles. Mite antigen is easily measured in the air during house cleaning activities. Dust mites do not bite and do not cause harm to humans, other than by triggering allergies/asthma.  Ways to decrease your exposure to dust mites in your home:  1. Encase mattresses, box springs and pillows with a mite-impermeable barrier or cover  2. Wash sheets, blankets and drapes weekly in hot  water (130 F) with detergent and dry them in a dryer on the hot setting.  3. Have the room cleaned frequently with a vacuum cleaner and a damp dust-mop. For carpeting or rugs, vacuuming with a vacuum cleaner equipped with a high-efficiency particulate air (HEPA) filter. The dust mite allergic individual should not be in a room which is being cleaned and should wait 1 hour after cleaning before going into the room.  4. Do not sleep on upholstered furniture (eg, couches).  5. If possible removing carpeting, upholstered furniture and drapery from the home is ideal. Horizontal blinds should be eliminated in the rooms where the person spends the most time (bedroom, study, television room). Washable vinyl, roller-type shades are optimal.  6. Remove all non-washable stuffed toys from the bedroom. Wash stuffed toys weekly like sheets and blankets above.  7. Reduce indoor humidity to less than 50%. Inexpensive humidity monitors can be purchased at most hardware stores. Do not use a humidifier as can make the problem worse and are not recommended.  Control of Dog or Cat Allergen Avoidance is the best way to manage a dog or cat allergy. If you have a dog or cat and are allergic to dog or  cats, consider removing the dog or cat from the home. If you have a dog or cat but don't want to find it a new home, or if your family wants a pet even though someone in the household is allergic, here are some strategies that may help keep symptoms at bay:  Keep the pet out of your bedroom and restrict it to only a few rooms. Be advised that keeping the dog or cat in only one room will not limit the allergens to that room. Don't pet, hug or kiss the dog or cat; if you do, wash your hands with soap and water. High-efficiency particulate air (HEPA) cleaners run continuously in a bedroom or living room can reduce allergen levels over time. Regular use of a high-efficiency vacuum cleaner or a central vacuum can reduce allergen  levels. Giving your dog or cat a bath at least once a week can reduce airborne allergen.

## 2021-09-25 NOTE — Telephone Encounter (Signed)
PA has been submitted through CoverMyMeds for Ryaltris and is currently pending approval/denial.  

## 2021-09-25 NOTE — Progress Notes (Signed)
[address] [place]  [place] [address] Dept: [phone]  FOLLOW UP NOTE  Patient ID: Sabrina Clark, female    DOB: [birth year]  Age: 65 y.o. MRN: [id number] Date of Office Visit: 09/25/2021  Assessment  Chief Complaint: Cough and Nasal Congestion  HPI Sabrina Clark is a 65 year old female who presents to the clinic for follow-up visit.  She presented to urgent care on 09/04/2021 for asthma and allergy exacerbation symptoms and received prednisone and ipratropium nasal spray.  She presented to our clinic 1 week later on 09/11/2021 for similar symptoms and began air duo 232 and Ryatrris nasal spray.  She presented to urgent care on 09/18/2021 where she received had a negative chest x-ray, negative influenza A, negative influenza B, and negative COVID testing.  She was given an IM dexamethasone sodium phosphate injection and discharged with oral prednisone as well as doxycycline.  At today's visit, She denies fever, sweats, chills, and sick contacts. She reports her asthma has been not well controlled with symptoms including cough which is described as" deep and raspy".  She denies shortness of breath with rest and activity as well as wheeze with rest and activity.  She reports her cough is occasionally dry and frequently producing thick clear mucus.  She continues Air Duo 232-1 puff twice a day and uses albuterol about 2-3 times a day with moderate relief of symptoms.  She reports that before her symptoms began in mid November, she had not used a daily inhaler to control asthma.  Allergic rhinitis is reported as poorly controlled with thick clear nasal secretions, nasal congestion, and postnasal drainage that is reported as thick and stuck in her throat.  She denies sore throat, however, is concerned about a small tan spot on her soft palate that occurred about 2 weeks ago.  She reports bilateral intermittent ear fullness and somewhat muffled hearing.  She reports losing taste and smell 3  days ago and attributes this to blowing her nose frequently.  She does report intermittent headache and occasional sinus pressure.  She continues Allegra 180 mg once a day and Flonase 2 sprays in each nostril once a day.  She is not currently using a nasal saline rinse.  She does report some improvement while she had been using a sample of Ryaltris nasal spray.  Reflux is reported as well controlled with no symptoms including heartburn or vomiting.    Her current medications are listed in the chart.   Drug Allergies:  Allergies  Allergen Reactions   Codeine Hives    Itch     Cefdinir    Diphenhydramine Hcl Hives    Physical Exam: BP 124/64 (BP Location: Left Arm, Patient Position: Sitting, Cuff Size: Normal)   Pulse 81   Temp (!) 97.3 F (36.3 C) (Temporal)   Resp 16   SpO2 98%    Physical Exam Vitals reviewed.  Constitutional:      Appearance: Normal appearance.  HENT:     Head: Normocephalic and atraumatic.     Right Ear: Tympanic membrane normal.     Left Ear: Tympanic membrane normal.     Nose:     Comments: Bilateral nares edematous with thick nasal drainage noted.  Pharynx slightly erythematous with no exudate.  Ears normal.  Eyes normal.    Mouth/Throat:     Comments: Small tan-colored circular area left soft palate. About 1/2 the size of a pencil eraser Eyes:     Conjunctiva/sclera: Conjunctivae normal.  Cardiovascular:  Rate and Rhythm: Normal rate and regular rhythm.     Heart sounds: Normal heart sounds. No murmur heard. Pulmonary:     Effort: Pulmonary effort is normal.     Comments: Expiratory bilateral wheeze which cleared post bronchodilator therapy Musculoskeletal:        General: Normal range of motion.     Cervical back: Normal range of motion and neck supple.  Skin:    General: Skin is warm and dry.  Neurological:     Mental Status: She is alert and oriented to person, place, and time.  Psychiatric:        Mood and Affect: Mood normal.         Behavior: Behavior normal.        Thought Content: Thought content normal.        Judgment: Judgment normal.    Diagnostics: FVC 2.20, FEV1 1.63.  Predicted FVC 2.73, predicted FEV1 2.16.  Spirometry indicates normal ventilatory function.  Postbronchodilator FVC 2.30, FEV1 1.71.  Postbronchodilator spirometry indicates no significant bronchodilator response.  Assessment and Plan: 1. Not well controlled moderate persistent asthma   2. Gastroesophageal reflux disease, unspecified whether esophagitis present   3. Seasonal and perennial allergic rhinitis   4. Dysfunction of both eustachian tubes   5. Anosmia   6. Ageusia     Meds ordered this encounter  Medications   EPINEPHrine (AUVI-Q) 0.3 mg/0.3 mL IJ SOAJ injection    Sig: Inject 0.3 mg into the muscle as needed for anaphylaxis.    Dispense:  2 each    Refill:  2    [phone]   TRELEGY ELLIPTA 200-62.5-25 MCG/ACT AEPB    Sig: Inhale 1 puff into the lungs daily.    Dispense:  28 each    Refill:  5   RYALTRIS 665-25 MCG/ACT SUSP    Sig: Place 2 sprays into the nose in the morning and at bedtime.    Dispense:  29 g    Refill:  5    [phone]   ipratropium (ATROVENT) 0.03 % nasal spray    Sig: Place 2 sprays into both nostrils every 12 (twelve) hours.    Dispense:  30 mL    Refill:  5    Patient Instructions  Asthma Begin Trelegy 1 puff once a day to prevent cough or wheeze Continue albuterol 2 puffs once every 4 hours as needed for cough or wheeze You may use albuterol 2 puffs 5 to 15 minutes before activity to decrease cough or wheeze  Allergic rhinitis Stop Allegra for the next week, then continue Allegra 180 mg once a day as needed for runny nose or itch Begin Mucinex [phone] mg twice a day and increase fluid intake to thin mucus secretions Restart Ryaltris 2 sprays in each nostril twice a day for nasal symptoms.  Begin saline nasal rinses as needed for nasal symptoms. Use this before any medicated nasal sprays  for best result Continue ipratropium nasal spray 2 sprays in each nostril twice a day as needed for runny nose Continue allergen avoidance measures directed toward pollens, mold, dust mite, and pets as listed below Continue allergen immunotherapy and have access to an epinephrine autoinjector.  Do not receive your allergy injection until your breathing has returned to baseline  Eustachian tube dysfunction Continue daily nasal saline rinses as well as nasal steroid sprays  Reflux Continue dietary and lifestyle modifications as listed below  Continue doxycycline provided by urgent care until the end of the course of this  medication  Call the clinic if this treatment plan is not working well for you.  Follow up in 4 weeks or sooner if needed.   Return in about 4 weeks (around 10/23/2021), or if symptoms worsen or fail to improve.    Thank you for the opportunity to care for this patient.  Please do not hesitate to contact me with questions.  Thermon Leyland, FNP [facility]

## 2021-09-26 IMAGING — US US BREAST*L* LIMITED INC AXILLA
2 series · 11 of 11 positions shown · non-contrast
Comparison: Previous exam(s).

CLINICAL DATA: Patient presents for delayed follow-up of probable
cluster of cysts within the outer left breast. Additionally, patient
complains of bilateral nipple tenderness.

EXAM:
DIGITAL DIAGNOSTIC BILATERAL MAMMOGRAM WITH CAD AND TOMO
ULTRASOUND BILATERAL BREAST

[Series 1: us breast*left* limited inc axilla · 0.06mm/px · 5 of 5 slices shown (1 of 2)]
[im 1/5]
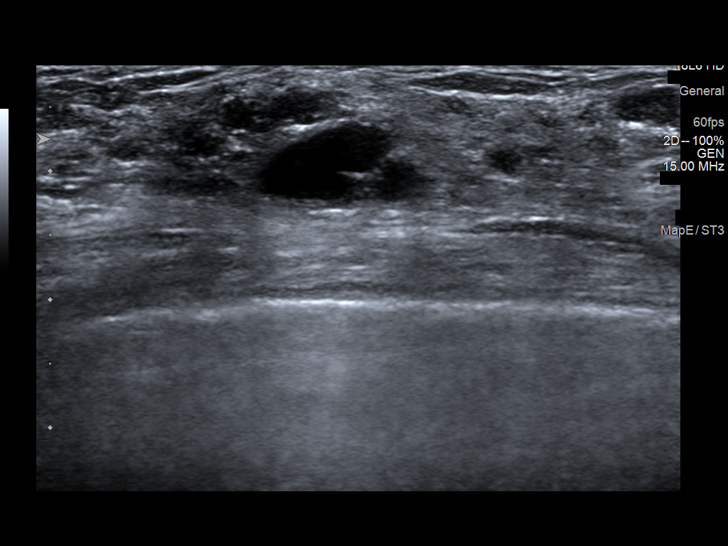
[im 2/5]
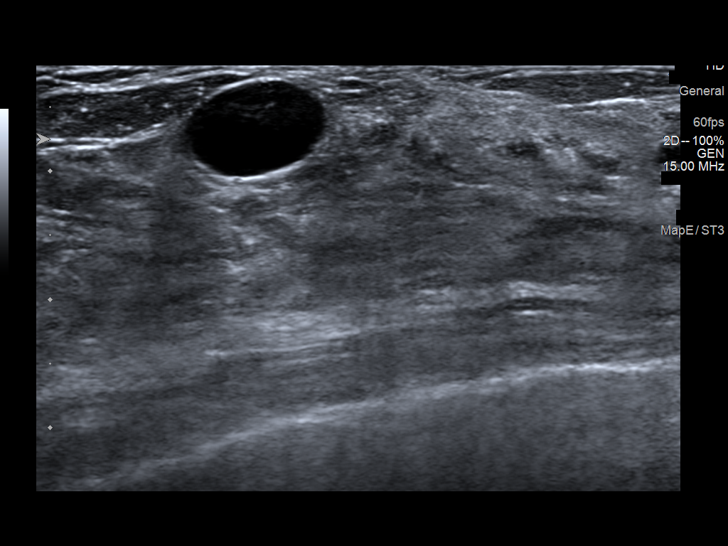
[im 3/5]
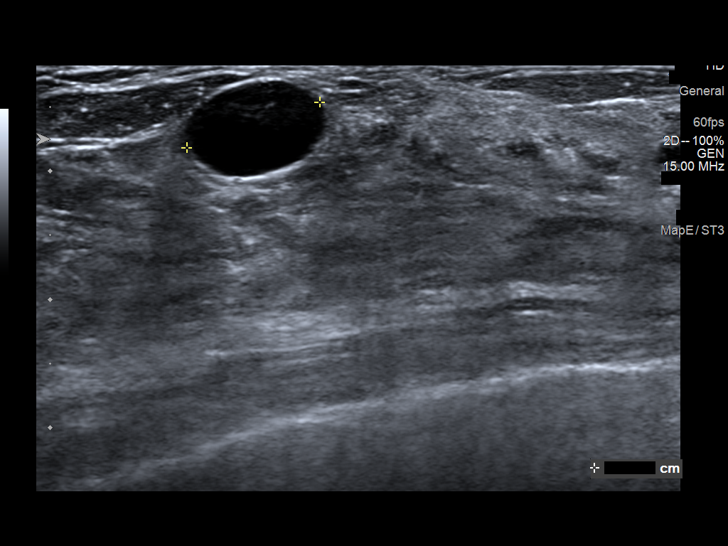
[im 4/5]
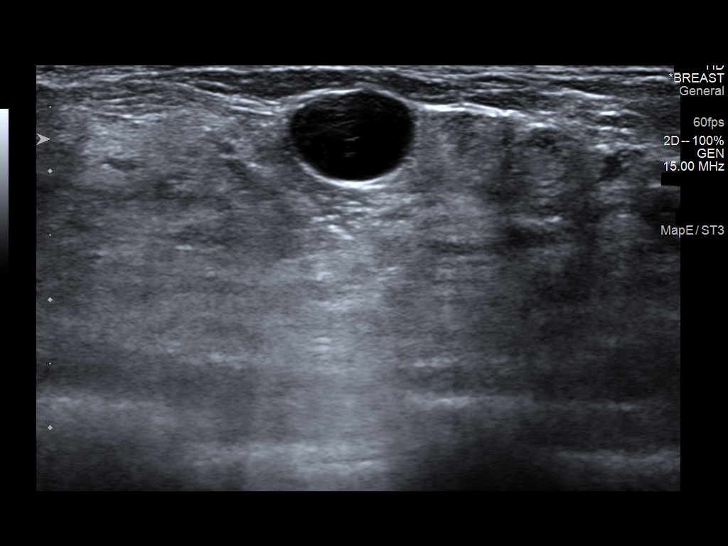
[im 5/5]
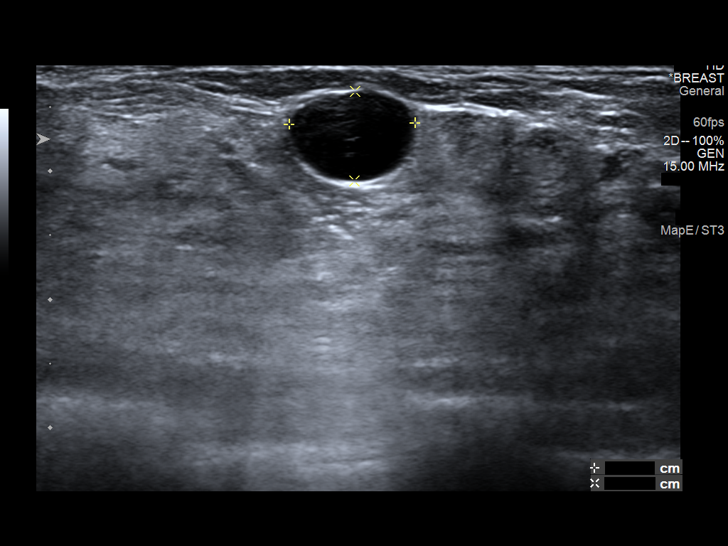

[Series 2: us breast*left* limited inc axilla · 0.05mm/px · 6 of 6 slices shown (2 of 2)]
[im 1/6]
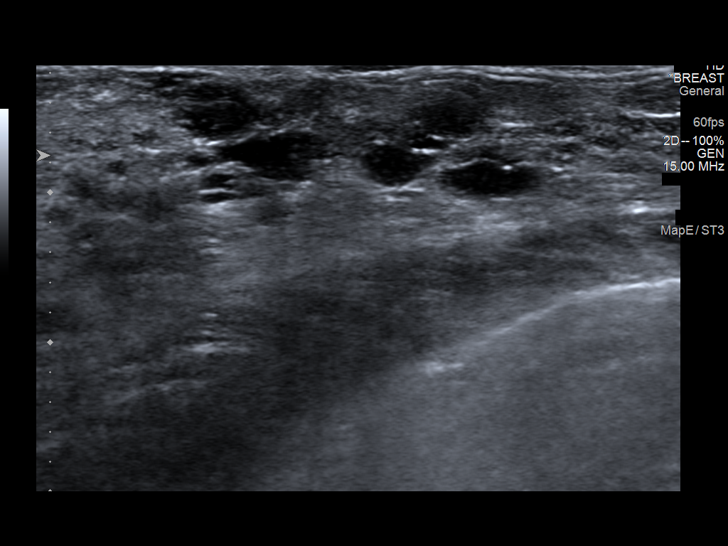
[im 2/6]
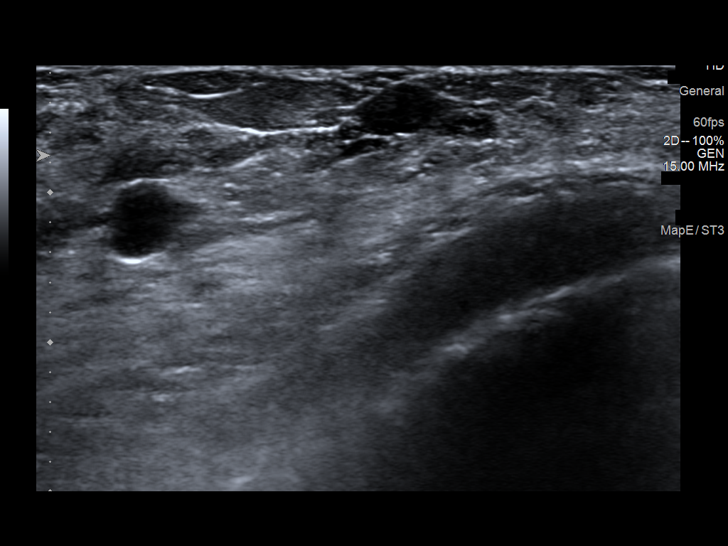
[im 3/6]
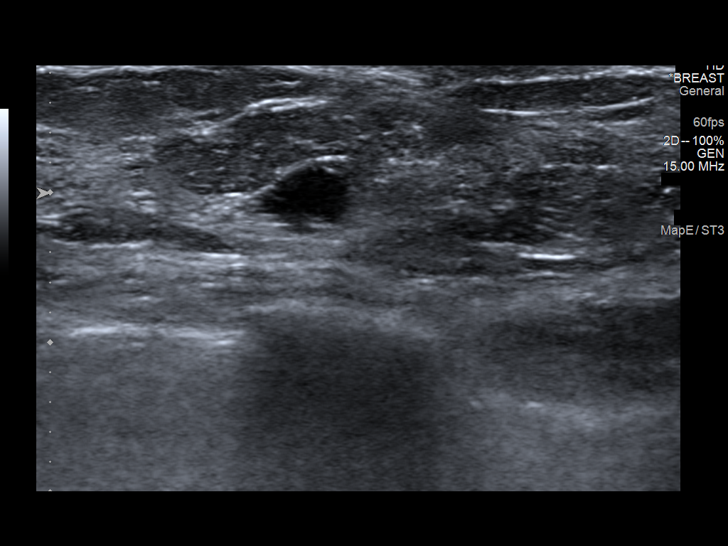
[im 4/6]
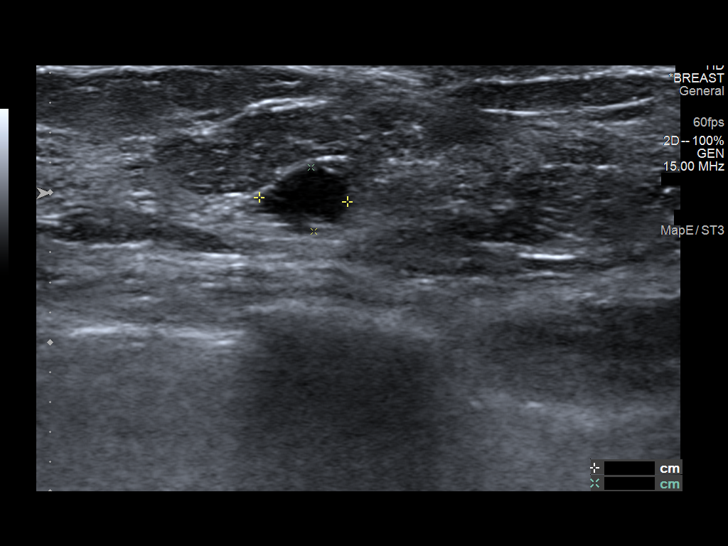
[im 5/6]
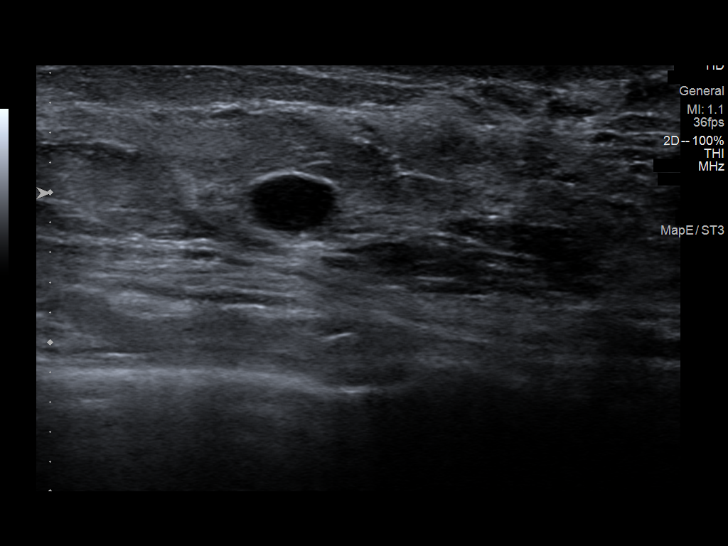
[im 6/6]
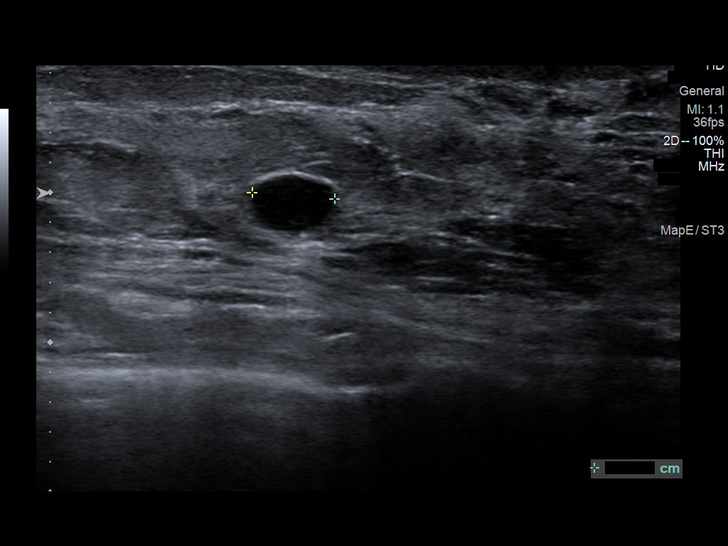

[11 of 11 positions shown; findings below may reference images not displayed]

ACR Breast Density Category c: The breast tissue is heterogeneously
dense, which may obscure small masses.
FINDINGS: Multiple bilateral oval circumscribed masses are demonstrated, most
suggestive of fluctuating cysts. Biopsy marking clips demonstrated
within the outer left breast. Oval circumscribed masses within the
retroareolar right and left breast.

Mammographic images were processed with CAD.

Targeted ultrasound is performed, showing an 11 x 12 x 8 mm cyst
right breast 9 o'clock position 2 cm from nipple.

Within the left breast 2 o'clock position 6 cm from nipple there is
an 11 x 10 x 7 mm simple cyst.

Within the left breast 3 o'clock position 3 cm from nipple there
multiple adjacent cysts. It is difficult to measure the exact area
of fibrocystic change to compare with prior exam.

Within the left breast 9 o'clock position 3 cm from nipple there is
a 6 x 6 x 4 mm cyst.
IMPRESSION: There are extensive fibrocystic changes demonstrated within the
outer left breast, grossly similar when compared to prior exam.

There is a cyst within the retroareolar right breast which may cause
the nipple tenderness.

RECOMMENDATION:
Bilateral diagnostic mammography with left breast ultrasound in 12
months to reassess extensive fibrocystic changes outer left breast.

I have discussed the findings and recommendations with the patient.
If applicable, a reminder letter will be sent to the patient
regarding the next appointment.

BI-RADS CATEGORY  3: Probably benign.

## 2021-09-26 NOTE — Telephone Encounter (Signed)
PA has been resubmitted through [employer] for Ryaltris including the trial and failure of Flonase.

## 2021-09-26 NOTE — Telephone Encounter (Signed)
PA was denied, stating patient will need to try and fail Flonase and/or budesonide. I do believe patient has tried and failed Flonase. Will need to renew PA with this information.

## 2021-10-02 NOTE — Telephone Encounter (Signed)
PA has been approved for Ryaltris. PA has been faxed to pharmacy, labeled, and placed in bulk scanning. Called patient and left a voicemail asking for her to return call to inform.

## 2021-10-04 NOTE — Telephone Encounter (Signed)
Called and informed patient of approval, patient verbalized understanding.

## 2021-10-23 ENCOUNTER — Encounter: Payer: Self-pay | Admitting: Family Medicine

## 2021-10-23 ENCOUNTER — Ambulatory Visit (INDEPENDENT_AMBULATORY_CARE_PROVIDER_SITE_OTHER): Payer: BC Managed Care – PPO | Admitting: Family Medicine

## 2021-10-23 ENCOUNTER — Other Ambulatory Visit: Payer: Self-pay

## 2021-10-23 ENCOUNTER — Ambulatory Visit (INDEPENDENT_AMBULATORY_CARE_PROVIDER_SITE_OTHER): Payer: BC Managed Care – PPO | Admitting: *Deleted

## 2021-10-23 VITALS — BP 114/62 | HR 77 | Temp 98.7°F | Resp 16 | Ht 60.5 in | Wt 122.0 lb

## 2021-10-23 DIAGNOSIS — J4520 Mild intermittent asthma, uncomplicated: Secondary | ICD-10-CM | POA: Diagnosis not present

## 2021-10-23 DIAGNOSIS — R430 Anosmia: Secondary | ICD-10-CM

## 2021-10-23 DIAGNOSIS — J3089 Other allergic rhinitis: Secondary | ICD-10-CM | POA: Diagnosis not present

## 2021-10-23 DIAGNOSIS — R432 Parageusia: Secondary | ICD-10-CM

## 2021-10-23 DIAGNOSIS — J309 Allergic rhinitis, unspecified: Secondary | ICD-10-CM | POA: Diagnosis not present

## 2021-10-23 DIAGNOSIS — K219 Gastro-esophageal reflux disease without esophagitis: Secondary | ICD-10-CM

## 2021-10-23 DIAGNOSIS — J302 Other seasonal allergic rhinitis: Secondary | ICD-10-CM

## 2021-10-23 MED ORDER — RYALTRIS 665-25 MCG/ACT NA SUSP: 2.0000 | Freq: Two times a day (BID) | NASAL | 5 refills | Status: DC

## 2021-10-23 MED ORDER — IPRATROPIUM BROMIDE 0.03 % NA SOLN: 2.0000 | Freq: Two times a day (BID) | NASAL | 5 refills | Status: DC

## 2021-10-23 MED ORDER — FEXOFENADINE HCL 180 MG PO TABS
180.0000 mg | ORAL_TABLET | Freq: Two times a day (BID) | ORAL | 5 refills | Status: AC | PRN
Start: 2021-10-23 — End: ?

## 2021-10-23 MED ORDER — ALBUTEROL SULFATE HFA 108 (90 BASE) MCG/ACT IN AERS: 2.0000 | INHALATION_SPRAY | RESPIRATORY_TRACT | 1 refills | Status: DC | PRN

## 2021-10-23 MED ORDER — EPINEPHRINE 0.3 MG/0.3ML IJ SOAJ: 0.3000 mg | INTRAMUSCULAR | 1 refills | Status: DC | PRN

## 2021-10-23 MED ORDER — TRELEGY ELLIPTA 200-62.5-25 MCG/ACT IN AEPB: 1.0000 | INHALATION_SPRAY | Freq: Every day | RESPIRATORY_TRACT | 5 refills | Status: DC

## 2021-10-23 NOTE — Progress Notes (Signed)
[address] [place] [address] Dept: [phone]  FOLLOW UP NOTE  Patient ID: Sabrina Clark, female    DOB: Apr 17, 1956  Age: 66 y.o. MRN: [id number] Date of Office Visit: 10/23/2021  Assessment  Chief Complaint: Asthma (Says her asthma is 100% better. Says she has stopped inhalers and nasal sprays. ) and Allergic Rhinitis  (Says allergies are great and immunotherapy is going well. )  HPI Sabrina Clark is a 66 year old female who presents the clinic for follow-up visit.  She was last seen in this clinic on 09/25/2021 by Thermon Leyland, FNP, for evaluation of asthma, allergic rhinitis on allergen immunotherapy, eustachian tube dysfunction, and reflux.  At that time, she had been to urgent care 2 times and had negative chest x-ray, negative influenza a influenza B, and COVID testing.  At today's visit, she reports her asthma has been well controlled over the last several weeks with no shortness of breath, cough, or wheeze with activity or rest.  She reports a couple of weeks ago she began using Trelegy every other day and is currently not using Trelegy or albuterol.  Allergic rhinitis is reported as well controlled with occasional postnasal drainage.  She stopped using Ryaltris nasal spray about 1 week ago and is not currently using Allegra, Nasacort, or nasal saline rinses.  Reflux is reported as well controlled with no symptoms including vomiting or heartburn and she is not currently taking a medication to control reflux.  Her current medications are listed in the chart.   Drug Allergies:  Allergies  Allergen Reactions   Codeine Hives    Itch     Cefdinir    Diphenhydramine Hcl Hives    Physical Exam: BP 114/62    Pulse 77    Temp 98.7 F (37.1 C) (Temporal)    Resp 16    Ht 5' 0.5" (1.537 m)    Wt 122 lb (55.3 kg)    SpO2 98%    BMI 23.43 kg/m    Physical Exam Vitals reviewed.  Constitutional:      Appearance: Normal appearance.  HENT:     Head: Normocephalic and  atraumatic.     Right Ear: Tympanic membrane normal.     Left Ear: Tympanic membrane normal.     Nose:     Comments: Bilateral nares slightly erythematous with clear nasal drainage noted.  Pharynx normal.  Ears normal.  Eyes normal.    Mouth/Throat:     Pharynx: Oropharynx is clear.  Eyes:     Conjunctiva/sclera: Conjunctivae normal.  Cardiovascular:     Rate and Rhythm: Normal rate and regular rhythm.     Heart sounds: Normal heart sounds. No murmur heard. Pulmonary:     Effort: Pulmonary effort is normal.     Breath sounds: Normal breath sounds.     Comments: Lungs clear to auscultation Musculoskeletal:        General: Normal range of motion.     Cervical back: Normal range of motion and neck supple.  Skin:    General: Skin is warm and dry.  Neurological:     Mental Status: She is alert and oriented to person, place, and time.  Psychiatric:        Mood and Affect: Mood normal.        Behavior: Behavior normal.        Thought Content: Thought content normal.        Judgment: Judgment normal.    Diagnostics: No spirometry testing as spirometry  equipment is malfunctioning in the clinic today  Assessment and Plan: 1. Mild intermittent asthma without complication   2. Seasonal and perennial allergic rhinitis   3. Gastroesophageal reflux disease, unspecified whether esophagitis present   4. Anosmia   5. Ageusia     Meds ordered this encounter  Medications   EPINEPHrine (EPIPEN 2-PAK) 0.3 mg/0.3 mL IJ SOAJ injection    Sig: Inject 0.3 mg into the muscle as needed for anaphylaxis.    Dispense:  2 each    Refill:  1   TRELEGY ELLIPTA 200-62.5-25 MCG/ACT AEPB    Sig: Inhale 1 puff into the lungs daily.    Dispense:  28 each    Refill:  5   RYALTRIS 665-25 MCG/ACT SUSP    Sig: Place 2 sprays into the nose in the morning and at bedtime.    Dispense:  29 g    Refill:  5    [phone]   ipratropium (ATROVENT) 0.03 % nasal spray    Sig: Place 2 sprays into both nostrils  every 12 (twelve) hours.    Dispense:  30 mL    Refill:  5   fexofenadine (ALLEGRA) 180 MG tablet    Sig: Take 1 tablet (180 mg total) by mouth 2 (two) times daily as needed for allergies or rhinitis (Can use an extra dose during flare ups.). Now taking daily per allergist.    Dispense:  60 tablet    Refill:  5   albuterol (VENTOLIN HFA) 108 (90 Base) MCG/ACT inhaler    Sig: Inhale 2 puffs into the lungs every 4 (four) hours as needed for wheezing or shortness of breath.    Dispense:  18 g    Refill:  1    Please dispense two inhalers.    Patient Instructions  Asthma Continue albuterol 2 puffs once every 4 hours as needed for cough or wheeze You may use albuterol 2 puffs 5 to 15 minutes before activity to decrease cough or wheeze For asthma flare, begin Trelegy 200-1 puff once a day for 2 weeks or until cough and wheeze free. Call the clinic if you need to start this  Allergic rhinitis Continue allergen avoidance measures directed toward pollens, mold, dust mite, and pets as listed below Continue allergen immunotherapy and have access to an epinephrine auto-injector set.   Continue Allegra 180 mg once a day as needed for runny nose or itch and on days that you receive allergen immunotherapy Continue Nasacort 2 sprays in each nostril once a day as needed for a stuffy nose.  In the right nostril, point the applicator out toward the right ear. In the left nostril, point the applicator out toward the left ear Consider saline nasal rinses as needed for nasal symptoms. Use this before any medicated nasal sprays for best result For an allergy flare: Begin Mucinex [phone] mg twice a day and increase fluid intake to thin mucus secretions Restart Ryaltris 2 sprays in each nostril twice a day for nasal symptoms and stop Nasacort while using Ryaltris  Eustachian tube dysfunction Continue daily nasal saline rinses as well as nasal steroid sprays  Reflux Continue dietary and lifestyle  modifications as listed below  Call the clinic if this treatment plan is not working well for you.  Follow up in 5 months or sooner if needed.   Return in about 5 months (around 03/23/2022), or if symptoms worsen or fail to improve.    Thank you for the opportunity to care for this  patient.  Please do not hesitate to contact me with questions.  Gareth Morgan, FNP [facility]

## 2021-10-23 NOTE — Patient Instructions (Addendum)
Asthma Continue albuterol 2 puffs once every 4 hours as needed for cough or wheeze You may use albuterol 2 puffs 5 to 15 minutes before activity to decrease cough or wheeze For asthma flare, begin Trelegy 200-1 puff once a day for 2 weeks or until cough and wheeze free. Call the clinic if you need to start this  Allergic rhinitis Continue allergen avoidance measures directed toward pollens, mold, dust mite, and pets as listed below Continue allergen immunotherapy and have access to an epinephrine auto-injector set.   Continue Allegra 180 mg once a day as needed for runny nose or itch and on days that you receive allergen immunotherapy Continue Nasacort 2 sprays in each nostril once a day as needed for a stuffy nose.  In the right nostril, point the applicator out toward the right ear. In the left nostril, point the applicator out toward the left ear Consider saline nasal rinses as needed for nasal symptoms. Use this before any medicated nasal sprays for best result For an allergy flare: Begin Mucinex [phone] mg twice a day and increase fluid intake to thin mucus secretions Restart Ryaltris 2 sprays in each nostril twice a day for nasal symptoms and stop Nasacort while using Ryaltris  Eustachian tube dysfunction Continue daily nasal saline rinses as well as nasal steroid sprays  Reflux Continue dietary and lifestyle modifications as listed below  Call the clinic if this treatment plan is not working well for you.  Follow up in 5 months or sooner if needed.   Lifestyle Changes for Controlling GERD When you have GERD, stomach acid feels as if its backing up toward your mouth. Whether or not you take medication to control your GERD, your symptoms can often be improved with lifestyle changes.   Raise Your Head Reflux is more likely to strike when youre lying down flat, because stomach fluid can flow backward more easily. Raising the head of your bed 4-6 inches can help. To do  this: Slide blocks or books under the legs at the head of your bed. Or, place a wedge under the mattress. Many foam stores can make a suitable wedge for you. The wedge should run from your waist to the top of your head. Dont just prop your head on several pillows. This increases pressure on your stomach. It can make GERD worse.  Watch Your Eating Habits Certain foods may increase the acid in your stomach or relax the lower esophageal sphincter, making GERD more likely. Its best to avoid the following: Coffee, tea, and carbonated drinks (with and without caffeine) Fatty, fried, or spicy food Mint, chocolate, onions, and tomatoes Any other foods that seem to irritate your stomach or cause you pain  Relieve the Pressure Eat smaller meals, even if you have to eat more often. Dont lie down right after you eat. Wait a few hours for your stomach to empty. Avoid tight belts and tight-fitting clothes. Lose excess weight.  Tobacco and Alcohol Avoid smoking tobacco and drinking alcohol. They can make GERD symptoms worse.  Reducing Pollen Exposure The American Academy of Allergy, Asthma and Immunology suggests the following steps to reduce your exposure to pollen during allergy seasons. Do not hang sheets or clothing out to dry; pollen may collect on these items. Do not mow lawns or spend time around freshly cut grass; mowing stirs up pollen. Keep windows closed at night.  Keep car windows closed while driving. Minimize morning activities outdoors, a time when pollen counts are usually at their highest.  Stay indoors as much as possible when pollen counts or humidity is high and on windy days when pollen tends to remain in the air longer. Use air conditioning when possible.  Many air conditioners have filters that trap the pollen spores. Use a HEPA room air filter to remove pollen form the indoor air you breathe.  Control of Mold Allergen Mold and fungi can grow on a variety of surfaces  provided certain temperature and moisture conditions exist.  Outdoor molds grow on plants, decaying vegetation and soil.  The major outdoor mold, Alternaria and Cladosporium, are found in very high numbers during hot and dry conditions.  Generally, a late Summer - Fall peak is seen for common outdoor fungal spores.  Rain will temporarily lower outdoor mold spore count, but counts rise rapidly when the rainy period ends.  The most important indoor molds are Aspergillus and Penicillium.  Dark, humid and poorly ventilated basements are ideal sites for mold growth.  The next most common sites of mold growth are the bathroom and the kitchen.  Outdoor [employer] Use air conditioning and keep windows closed Avoid exposure to decaying vegetation. Avoid leaf raking. Avoid grain handling. Consider wearing a face mask if working in moldy areas.  Indoor Mold Control Maintain humidity below 50%. Clean washable surfaces with 5% bleach solution. Remove sources e.g. Contaminated carpets.   Control of Dust Mite Allergen Dust mites play a major role in allergic asthma and rhinitis. They occur in environments with high humidity wherever human skin is found. Dust mites absorb humidity from the atmosphere (ie, they do not drink) and feed on organic matter (including shed human and animal skin). Dust mites are a microscopic type of insect that you cannot see with the naked eye. High levels of dust mites have been detected from mattresses, pillows, carpets, upholstered furniture, bed covers, clothes, soft toys and any woven material. The principal allergen of the dust mite is found in its feces. A gram of dust may contain 1,000 mites and 250,000 fecal particles. Mite antigen is easily measured in the air during house cleaning activities. Dust mites do not bite and do not cause harm to humans, other than by triggering allergies/asthma.  Ways to decrease your exposure to dust mites in your home:  1. Encase mattresses,  box springs and pillows with a mite-impermeable barrier or cover  2. Wash sheets, blankets and drapes weekly in hot water (130 F) with detergent and dry them in a dryer on the hot setting.  3. Have the room cleaned frequently with a vacuum cleaner and a damp dust-mop. For carpeting or rugs, vacuuming with a vacuum cleaner equipped with a high-efficiency particulate air (HEPA) filter. The dust mite allergic individual should not be in a room which is being cleaned and should wait 1 hour after cleaning before going into the room.  4. Do not sleep on upholstered furniture (eg, couches).  5. If possible removing carpeting, upholstered furniture and drapery from the home is ideal. Horizontal blinds should be eliminated in the rooms where the person spends the most time (bedroom, study, television room). Washable vinyl, roller-type shades are optimal.  6. Remove all non-washable stuffed toys from the bedroom. Wash stuffed toys weekly like sheets and blankets above.  7. Reduce indoor humidity to less than 50%. Inexpensive humidity monitors can be purchased at most hardware stores. Do not use a humidifier as can make the problem worse and are not recommended.  Control of Dog or Cat Allergen Avoidance is  the best way to manage a dog or cat allergy. If you have a dog or cat and are allergic to dog or cats, consider removing the dog or cat from the home. If you have a dog or cat but dont want to find it a new home, or if your family wants a pet even though someone in the household is allergic, here are some strategies that may help keep symptoms at bay:  Keep the pet out of your bedroom and restrict it to only a few rooms. Be advised that keeping the dog or cat in only one room will not limit the allergens to that room. Dont pet, hug or kiss the dog or cat; if you do, wash your hands with soap and water. High-efficiency particulate air (HEPA) cleaners run continuously in a bedroom or living room can  reduce allergen levels over time. Regular use of a high-efficiency vacuum cleaner or a central vacuum can reduce allergen levels. Giving your dog or cat a bath at least once a week can reduce airborne allergen.

## 2021-10-30 ENCOUNTER — Ambulatory Visit (INDEPENDENT_AMBULATORY_CARE_PROVIDER_SITE_OTHER): Payer: BC Managed Care – PPO

## 2021-10-30 DIAGNOSIS — J309 Allergic rhinitis, unspecified: Secondary | ICD-10-CM

## 2021-11-06 ENCOUNTER — Ambulatory Visit (INDEPENDENT_AMBULATORY_CARE_PROVIDER_SITE_OTHER): Payer: BC Managed Care – PPO

## 2021-11-06 DIAGNOSIS — J309 Allergic rhinitis, unspecified: Secondary | ICD-10-CM

## 2021-11-08 ENCOUNTER — Other Ambulatory Visit: Payer: Self-pay | Admitting: Obstetrics and Gynecology

## 2021-11-08 DIAGNOSIS — Z9889 Other specified postprocedural states: Secondary | ICD-10-CM

## 2021-11-13 ENCOUNTER — Ambulatory Visit (INDEPENDENT_AMBULATORY_CARE_PROVIDER_SITE_OTHER): Payer: BC Managed Care – PPO

## 2021-11-13 DIAGNOSIS — J309 Allergic rhinitis, unspecified: Secondary | ICD-10-CM

## 2021-11-24 ENCOUNTER — Ambulatory Visit (INDEPENDENT_AMBULATORY_CARE_PROVIDER_SITE_OTHER): Payer: BC Managed Care – PPO

## 2021-11-24 DIAGNOSIS — J309 Allergic rhinitis, unspecified: Secondary | ICD-10-CM

## 2021-12-12 ENCOUNTER — Other Ambulatory Visit: Payer: BC Managed Care – PPO

## 2021-12-19 ENCOUNTER — Ambulatory Visit (INDEPENDENT_AMBULATORY_CARE_PROVIDER_SITE_OTHER): Payer: BC Managed Care – PPO | Admitting: *Deleted

## 2021-12-19 DIAGNOSIS — J309 Allergic rhinitis, unspecified: Secondary | ICD-10-CM

## 2022-01-04 ENCOUNTER — Ambulatory Visit
Admission: RE | Admit: 2022-01-04 | Discharge: 2022-01-04 | Disposition: A | Discharge status: Home / Self Care | Payer: BC Managed Care – PPO | Source: Ambulatory Visit | Attending: Obstetrics and Gynecology | Admitting: Obstetrics and Gynecology

## 2022-01-04 ENCOUNTER — Other Ambulatory Visit: Payer: Self-pay | Admitting: Obstetrics and Gynecology

## 2022-01-04 DIAGNOSIS — Z9889 Other specified postprocedural states: Secondary | ICD-10-CM

## 2022-01-04 LAB — HM MAMMOGRAPHY

## 2022-01-08 ENCOUNTER — Ambulatory Visit (INDEPENDENT_AMBULATORY_CARE_PROVIDER_SITE_OTHER): Payer: BC Managed Care – PPO

## 2022-01-08 DIAGNOSIS — J309 Allergic rhinitis, unspecified: Secondary | ICD-10-CM

## 2022-01-15 ENCOUNTER — Encounter: Payer: Self-pay | Admitting: Family Medicine

## 2022-01-15 NOTE — Telephone Encounter (Signed)
Can you please let this patient know that she should continue the Trelegy for at least 2 weeks or until cough and wheeze free. Continue using Ryaltrys for 2 weeks or until nasal symptoms clear. Please have her call the clinic with any worsening symptoms. Thank you

## 2022-01-29 ENCOUNTER — Ambulatory Visit (INDEPENDENT_AMBULATORY_CARE_PROVIDER_SITE_OTHER): Payer: BC Managed Care – PPO

## 2022-01-29 DIAGNOSIS — J309 Allergic rhinitis, unspecified: Secondary | ICD-10-CM | POA: Diagnosis not present

## 2022-03-08 ENCOUNTER — Ambulatory Visit (INDEPENDENT_AMBULATORY_CARE_PROVIDER_SITE_OTHER): Payer: BC Managed Care – PPO

## 2022-03-08 DIAGNOSIS — J309 Allergic rhinitis, unspecified: Secondary | ICD-10-CM

## 2022-03-15 ENCOUNTER — Ambulatory Visit (INDEPENDENT_AMBULATORY_CARE_PROVIDER_SITE_OTHER): Payer: BC Managed Care – PPO

## 2022-03-15 DIAGNOSIS — J309 Allergic rhinitis, unspecified: Secondary | ICD-10-CM | POA: Diagnosis not present

## 2022-03-22 ENCOUNTER — Ambulatory Visit (INDEPENDENT_AMBULATORY_CARE_PROVIDER_SITE_OTHER): Payer: BC Managed Care – PPO

## 2022-03-22 DIAGNOSIS — J309 Allergic rhinitis, unspecified: Secondary | ICD-10-CM | POA: Diagnosis not present

## 2022-03-28 LAB — COLOGUARD: Cologuard: NEGATIVE

## 2022-04-05 LAB — COLOGUARD: COLOGUARD: NEGATIVE

## 2022-04-25 ENCOUNTER — Ambulatory Visit (INDEPENDENT_AMBULATORY_CARE_PROVIDER_SITE_OTHER): Payer: BC Managed Care – PPO

## 2022-04-25 DIAGNOSIS — J309 Allergic rhinitis, unspecified: Secondary | ICD-10-CM

## 2022-05-03 ENCOUNTER — Emergency Department (HOSPITAL_COMMUNITY)
Admission: EM | Admit: 2022-05-03 | Discharge: 2022-05-03 | Disposition: A | Discharge status: Home / Self Care | Payer: BC Managed Care – PPO | Attending: Emergency Medicine | Admitting: Emergency Medicine

## 2022-05-03 ENCOUNTER — Ambulatory Visit: Admit: 2022-05-03 | Discharge status: Absent without leave | Payer: BC Managed Care – PPO

## 2022-05-03 ENCOUNTER — Other Ambulatory Visit: Payer: Self-pay

## 2022-05-03 DIAGNOSIS — R42 Dizziness and giddiness: Secondary | ICD-10-CM | POA: Diagnosis present

## 2022-05-03 DIAGNOSIS — J45909 Unspecified asthma, uncomplicated: Secondary | ICD-10-CM | POA: Diagnosis not present

## 2022-05-03 DIAGNOSIS — T7840XA Allergy, unspecified, initial encounter: Secondary | ICD-10-CM | POA: Diagnosis not present

## 2022-05-03 LAB — BASIC METABOLIC PANEL
Anion gap: 4 — ABNORMAL LOW (ref 5–15)
BUN: 10 mg/dL (ref 8–23)
CO2: 26 mmol/L (ref 22–32)
Calcium: 8.4 mg/dL — ABNORMAL LOW (ref 8.9–10.3)
Chloride: 109 mmol/L (ref 98–111)
Creatinine, Ser: 0.85 mg/dL (ref 0.44–1.00)
GFR, Estimated: >60 mL/min (ref >60)
Glucose, Bld: 90 mg/dL (ref 70–99)
Potassium: 3.9 mmol/L (ref 3.5–5.1)
Sodium: 139 mmol/L (ref 135–145)

## 2022-05-03 LAB — CBC
HCT: 35 % — ABNORMAL LOW (ref 36.0–46.0)
Hemoglobin: 11.8 g/dL — ABNORMAL LOW (ref 12.0–15.0)
MCH: 31.9 pg (ref 26.0–34.0)
MCHC: 33.7 g/dL (ref 30.0–36.0)
MCV: 94.6 fL (ref 80.0–100.0)
Platelets: 247 10*3/uL (ref 150–400)
RBC: 3.7 MIL/uL — ABNORMAL LOW (ref 3.87–5.11)
RDW: 12.6 % (ref 11.5–15.5)
WBC: 8 10*3/uL (ref 4.0–10.5)
nRBC: 0 % (ref 0.0–0.2)

## 2022-05-03 MED ORDER — FAMOTIDINE 20 MG PO TABS
20.0000 mg | ORAL_TABLET | Freq: Once | ORAL | Status: AC
  Administered 2022-05-03: 20 mg via ORAL
  Filled 2022-05-03: qty 1

## 2022-05-03 MED ORDER — PREDNISONE 50 MG PO TABS
60.0000 mg | ORAL_TABLET | Freq: Once | ORAL | Status: AC
  Administered 2022-05-03: 60 mg via ORAL
  Filled 2022-05-03: qty 1

## 2022-05-03 MED ORDER — PREDNISONE 20 MG PO TABS: 40.0000 mg | ORAL_TABLET | Freq: Every day | ORAL | 0 refills | Status: DC

## 2022-05-03 NOTE — ED Triage Notes (Signed)
Pt in c/o lightheaded and dizzy after being stung by a bee today on the R hand, pt states, "I don't have a known allergy and have been stung before. I feel different now. I took two Benadryl and feel off." Airway intact, no facial edema, vitals WNL

## 2022-05-03 NOTE — Discharge Instructions (Addendum)
Please return to the ED with any new symptoms such as throat closure, trouble breathing, lip or facial swelling, hives Please take medication I sent in for you.  Please begin taking this medication tomorrow morning. Please read the attached guide concerning allergies Please follow-up with your primary care doctor for further management

## 2022-05-03 NOTE — ED Provider Notes (Signed)
[facility] EMERGENCY DEPARTMENT Provider Note   CSN: [id number] Arrival date & time: 05/03/22  1336     History  Chief Complaint  Patient presents with   Dizziness    Sabrina Clark is a 66 y.o. female with medical history of fibromyalgia, asthma, female hypogonadism syndrome, vitamin D deficiency.  The patient presents to the ED for evaluation of dizziness.  Patient reports that at 12:15 PM she was outside when she was stung by what she believes was a yellowjacket 2 times in her right hand.  The patient states that she went inside and took 50 mg of Benadryl at this time and about 20 minutes later became lightheaded, dizzy.  The patient states that at this time she also noticed that she had a "lump" in her throat which was making a little bit harder to swallow.  The patient states that her face feels as if it is tight however denies the feelings of her tongue swelling, or lip swelling or face swelling.  Patient denies any allergy to bees, wasps, yellow jackets.  The patient does state that she has a history of seasonal allergies but has never had an allergic reaction including anaphylaxis to bee stings.  The patient denies any feelings of urticaria, hives, nausea, vomiting, diarrhea, abdominal pain.   Dizziness Associated symptoms: no chest pain, no diarrhea, no nausea, no shortness of breath and no vomiting        Home Medications Prior to Admission medications   Medication Sig Start Date End Date Taking? Authorizing Provider  predniSONE (DELTASONE) 20 MG tablet Take 2 tablets (40 mg total) by mouth daily. 05/03/22  Yes Al Decant, PA-C  albuterol (VENTOLIN HFA) 108 (90 Base) MCG/ACT inhaler Inhale 2 puffs into the lungs every 4 (four) hours as needed for wheezing or shortness of breath. 10/23/21   Hetty Blend, FNP  butalbital-acetaminophen-caffeine (FIORICET) [phone] MG tablet Take 1 tablet by mouth daily as needed. Patient not taking: Reported on 10/23/2021 02/13/21   Wilson Singer, MD  Cholecalciferol (VITAMIN D3) 250 MCG (10000 UT) TABS Take 1 capsule by mouth daily.    [provider]  clonazePAM (KLONOPIN) 0.5 MG tablet Take 1 tablet (0.5 mg total) by mouth daily as needed. 10/19/20   Wilson Singer, MD  EPINEPHrine (EPIPEN 2-PAK) 0.3 mg/0.3 mL IJ SOAJ injection Inject 0.3 mg into the muscle as needed for anaphylaxis. 10/23/21   Hetty Blend, FNP  estradiol (ESTRACE) 2 MG tablet Take 1 tablet (2 mg total) by mouth daily. 05/26/21   Shade Flood, MD  fexofenadine (ALLEGRA) 180 MG tablet Take 1 tablet (180 mg total) by mouth 2 (two) times daily as needed for allergies or rhinitis (Can use an extra dose during flare ups.). Now taking daily per allergist. 10/23/21   Hetty Blend, FNP  ipratropium (ATROVENT) 0.03 % nasal spray Place 2 sprays into both nostrils every 12 (twelve) hours. 10/23/21   Ambs, Norvel Richards, FNP  Misc Natural Products (NF FORMULAS TESTOSTERONE) CAPS 1 capsule daily by mouth 04/25/21   Wilson Singer, MD  NP THYROID 30 MG tablet Take 1 tablet (30 mg total) by mouth every evening. 05/26/21 10/23/21  Shade Flood, MD  NP THYROID 60 MG tablet Take 1 tablet (60 mg total) by mouth daily before breakfast. 05/26/21   Shade Flood, MD  progesterone (PROMETRIUM) 100 MG capsule Take 3 capsules (300 mg total) by mouth at bedtime. 03/16/21   Wilson Singer, MD  RYALTRIS [id number] MCG/ACT SUSP Place 2 sprays into the nose in the morning and at bedtime. 10/23/21   Hetty Blend, FNP  TRELEGY ELLIPTA 200-62.5-25 MCG/ACT AEPB Inhale 1 puff into the lungs daily. 10/23/21   Hetty Blend, FNP      Allergies    Codeine, Cefdinir, and Diphenhydramine hcl    Review of Systems   Review of Systems  Constitutional:  Negative for fever.  HENT:  Positive for trouble swallowing. Negative for drooling.   Respiratory:  Negative for shortness of breath and wheezing.   Cardiovascular:  Negative for chest pain.  Gastrointestinal:  Negative for abdominal pain, diarrhea,  nausea and vomiting.  Skin:  Negative for rash.  Neurological:  Positive for dizziness.  All other systems reviewed and are negative.   Physical Exam Updated Vital Signs BP 122/67   Pulse 73   Temp 98.3 F (36.8 C) (Oral)   Resp (!) 21   SpO2 100%  Physical Exam Vitals and nursing note reviewed.  Constitutional:      General: She is not in acute distress.    Appearance: Normal appearance. She is not ill-appearing, toxic-appearing or diaphoretic.  HENT:     Head: Normocephalic and atraumatic.     Nose: Nose normal. No congestion.     Mouth/Throat:     Mouth: Mucous membranes are moist.     Pharynx: Oropharynx is clear. No oropharyngeal exudate or posterior oropharyngeal erythema.     Comments: Airway intact.  The patient uvula is midline.  The patient is not drooling, there is no trismus.  Patient oxygen saturation 100% on room air. Eyes:     Extraocular Movements: Extraocular movements intact.     Conjunctiva/sclera: Conjunctivae normal.     Pupils: Pupils are equal, round, and reactive to light.  Cardiovascular:     Rate and Rhythm: Normal rate and regular rhythm.  Pulmonary:     Effort: Pulmonary effort is normal.     Breath sounds: Normal breath sounds. No wheezing.  Abdominal:     General: Abdomen is flat. Bowel sounds are normal.     Palpations: Abdomen is soft.     Tenderness: There is no abdominal tenderness.  Musculoskeletal:     Cervical back: Normal range of motion and neck supple. No tenderness.  Skin:    General: Skin is warm and dry.     Capillary Refill: Capillary refill takes less than 2 seconds.     Comments: No urticaria  Neurological:     Mental Status: She is alert and oriented to person, place, and time.     ED Results / Procedures / Treatments   Labs (all labs ordered are listed, but only abnormal results are displayed) Labs Reviewed  CBC - Abnormal; Notable for the following components:      Result Value   RBC 3.70 (*)    Hemoglobin 11.8  (*)    HCT 35.0 (*)    All other components within normal limits  BASIC METABOLIC PANEL - Abnormal; Notable for the following components:   Calcium 8.4 (*)    Anion gap 4 (*)    All other components within normal limits    EKG EKG Interpretation  Date/Time:  Thursday May 03 2022 14:16:51 EDT Ventricular Rate:  62 PR Interval:  146 QRS Duration: 88 QT Interval:  395 QTC Calculation: 402 R Axis:   92 Text Interpretation: Sinus rhythm Nonspecific T wave abnormality Confirmed by Cathren Laine ([id number]) on 05/03/2022 2:19:54 PM  Radiology No results found.  Procedures Procedures   Medications Ordered in ED Medications  famotidine (PEPCID) tablet 20 mg (20 mg Oral Given 05/03/22 1421)  predniSONE (DELTASONE) tablet 60 mg (60 mg Oral Given 05/03/22 1421)    ED Course/ Medical Decision Making/ A&P                           Medical Decision Making Amount and/or Complexity of Data Reviewed Labs: ordered.   66 year old female presents to the ED for evaluation.  Please see HPI for further details.  On examination the patient is afebrile and nontachycardic.  The patient lung sounds are clear bilaterally, she is not hypoxic on room air.  There is no appearance of angioedema.  The patient posterior oropharynx has no exudate, erythema.  The patient's uvula is midline, she is handling her secretions appropriately, she is speaking in full sentences.  The patient has no abdominal tenderness.  The patient is nontoxic in appearance.  Patient provided with 60 mg of prednisone, 20 mg famotidine.  The patient was able to swallow these tablets without issue.  The patient states that she took 50 mg of Benadryl prior to arrival.  After patient was observed in the department for 2 hours and 15 minutes, she is requesting discharge.  I have discussed risks of being discharged at this time with the patient and she has voiced understanding.  The patient will be advised to follow-up with her PCP.  The  patient was given return precautions and she voiced understanding with these.  The patient had all of her questions answered to her satisfaction.  The patient is stable at this time for discharge home.  Final Clinical Impression(s) / ED Diagnoses Final diagnoses:  Allergic reaction, initial encounter    Rx / DC Orders ED Discharge Orders          Ordered    predniSONE (DELTASONE) 20 MG tablet  Daily        05/03/22 1555              Al Decant, [place] 05/03/22 1600    Cathren Laine, MD 05/04/22 1032

## 2022-05-16 ENCOUNTER — Ambulatory Visit (INDEPENDENT_AMBULATORY_CARE_PROVIDER_SITE_OTHER): Payer: BC Managed Care – PPO

## 2022-05-16 DIAGNOSIS — J309 Allergic rhinitis, unspecified: Secondary | ICD-10-CM

## 2022-06-15 ENCOUNTER — Ambulatory Visit (INDEPENDENT_AMBULATORY_CARE_PROVIDER_SITE_OTHER): Payer: BC Managed Care – PPO

## 2022-06-15 DIAGNOSIS — J309 Allergic rhinitis, unspecified: Secondary | ICD-10-CM

## 2022-06-25 DIAGNOSIS — J3089 Other allergic rhinitis: Secondary | ICD-10-CM | POA: Diagnosis not present

## 2022-06-25 NOTE — Progress Notes (Signed)
VIALS EXP 06-26-23 

## 2022-07-13 ENCOUNTER — Ambulatory Visit (INDEPENDENT_AMBULATORY_CARE_PROVIDER_SITE_OTHER): Payer: BC Managed Care – PPO

## 2022-07-13 DIAGNOSIS — J309 Allergic rhinitis, unspecified: Secondary | ICD-10-CM

## 2022-08-01 ENCOUNTER — Ambulatory Visit (INDEPENDENT_AMBULATORY_CARE_PROVIDER_SITE_OTHER): Payer: BC Managed Care – PPO | Admitting: Allergy & Immunology

## 2022-08-01 ENCOUNTER — Encounter: Payer: Self-pay | Admitting: Allergy & Immunology

## 2022-08-01 ENCOUNTER — Other Ambulatory Visit: Payer: Self-pay

## 2022-08-01 VITALS — BP 118/76 | HR 78 | Temp 98.3°F | Resp 18 | Ht 61.0 in | Wt 125.2 lb

## 2022-08-01 DIAGNOSIS — J3089 Other allergic rhinitis: Secondary | ICD-10-CM | POA: Diagnosis not present

## 2022-08-01 DIAGNOSIS — K219 Gastro-esophageal reflux disease without esophagitis: Secondary | ICD-10-CM

## 2022-08-01 DIAGNOSIS — J4520 Mild intermittent asthma, uncomplicated: Secondary | ICD-10-CM

## 2022-08-01 DIAGNOSIS — J4521 Mild intermittent asthma with (acute) exacerbation: Secondary | ICD-10-CM

## 2022-08-01 DIAGNOSIS — J302 Other seasonal allergic rhinitis: Secondary | ICD-10-CM

## 2022-08-01 NOTE — Progress Notes (Signed)
FOLLOW UP  Date of Service/Encounter:  08/01/22   Assessment:   Moderate persistent asthma, uncomplicated - with worsening symptoms in the last couple of weeks (therefore likely allergy mediated)   Seasonal and perennial allergic rhinitis (ragweed, weeds, grasses, indoor molds, dust mites, cat and dog)  Plan/Recommendations:   1. Mild intermittent asthma, uncomplicated - Lung testing looked good today. - I agree with the plan laid out by [facility] today. - We are going to give you a sample of the Trelegy today which lasts 14 days.  - Daily controller medication(s): NONE - Prior to physical activity: albuterol 2 puffs 10-15 minutes before physical activity. - Rescue medications: albuterol 4 puffs every 4-6 hours as needed - Changes during respiratory infections or worsening symptoms: Add on Trelegy 132mcg to 1 puff once daily for TWO TO THREE WEEKS. - Asthma control goals:  * Full participation in all desired activities (may need albuterol before activity) * Albuterol use two time or less a week on average (not counting use with activity) * Cough interfering with sleep two time or less a month * Oral steroids no more than once a year * No hospitalizations  2.Seasonal and perennial allergic rhinitis (ragweed, weeds, grasses, indoor molds, dust mites, cat and dog) - Continue with allergy shots frozen at 0.15 cc every three weeks (we will go to every three weeks at the next vial and then monthly at the vial after that).  - Continue with: Allegra (fexofenadine) 180mg  table once daily and Nasacort (triamcinolone) one spray per nostril daily. - May take an extra dose of Allegra on the days your allergy symptoms are worse. - Add on Ryaltris one spray per nostril up to twice daily (another sample provided).  3. Return in about 2 months (around 10/01/2022).   Subjective:   Sabrina Clark is a 66 y.o. female presenting today for follow up of  Chief Complaint  Patient presents with    Asthma    Has been needing her inhaler 3-4 times last week ( had the same issues but worse last year)     Sabrina Clark has a history of the following: Patient Active Problem List   Diagnosis Date Noted   Anosmia 09/25/2021   Ageusia 09/25/2021   Gastroesophageal reflux disease 09/11/2021   Dysfunction of both eustachian tubes 09/11/2021   Mild intermittent asthma with acute exacerbation 09/11/2021   Fibromyalgia 07/01/2019   Vitamin D deficiency disease    Female hypogonadism syndrome    Asthma    Malaise and fatigue    Seasonal and perennial allergic rhinitis 12/19/2018   Not well controlled moderate persistent asthma 12/19/2018    History obtained from: chart review and patient.  Sabrina Clark is a 66 y.o. female presenting for a sick visit. She was last seen in January 2023 by Webb Silversmith. At that time, she was continued on her plan of using Trelegy one puff once daily for future flares. For her rhinitis, she was continued on allergy shots as well as Allegra day. She was also continued on Rylatris two sprays per nostril BID.   In the interim, she has largely done well. She presents today for congestion and a possible flare.   She reports that she was sick for six weeks around the same time last October. THis was mostly head and cherst conegestion. She also notes that she was here two differet times with Webb Silversmith working on getting herself better.  Anyway, she is having the same symptoms this time at the same time.  She has been using her inhaler more. Her chest is feeling tight and she has been ahving a lot of head congestion. She has tested and has been negative for COVID. She has not had a fever at all.   She was placed on her Rylatris and Trelegy inhaler which finally did the trick. If she starts to feel that way again, she was told to start them. She has not started one at all.   She did use Ryaltris once in the spring and she had no other problems. She has done very well.   Otherwise, there  have been no changes to her past medical history, surgical history, family history, or social history.    Review of Systems  Constitutional: Negative.  Negative for chills, fever, malaise/fatigue and weight loss.  HENT:  Negative for congestion, ear discharge, ear pain and sinus pain.   Eyes:  Negative for pain, discharge and redness.  Respiratory:  Positive for cough and shortness of breath. Negative for sputum production and wheezing.   Cardiovascular: Negative.  Negative for chest pain and palpitations.  Gastrointestinal:  Negative for abdominal pain, constipation, diarrhea, heartburn, nausea and vomiting.  Skin: Negative.  Negative for itching and rash.  Neurological:  Negative for dizziness and headaches.  Endo/Heme/Allergies:  Negative for environmental allergies. Does not bruise/bleed easily.       Objective:   Blood pressure 118/76, pulse 78, temperature 98.3 F (36.8 C), resp. rate 18, height 5\' 1"  (1.549 m), weight 125 lb 3.2 oz (56.8 kg), SpO2 98 %. Body mass index is 23.66 kg/m.    Physical Exam Vitals reviewed.  Constitutional:      Appearance: She is well-developed.     Comments: Very talkative. She shows me pics of her now 7 goats.   HENT:     Head: Normocephalic and atraumatic.     Right Ear: Tympanic membrane, ear canal and external ear normal.     Left Ear: Tympanic membrane, ear canal and external ear normal.     Nose: No nasal deformity, septal deviation, mucosal edema or rhinorrhea.     Right Turbinates: Enlarged, swollen and pale.     Left Turbinates: Enlarged, swollen and pale.     Right Sinus: No maxillary sinus tenderness or frontal sinus tenderness.     Left Sinus: No maxillary sinus tenderness or frontal sinus tenderness.     Comments: No nasal polyps noted.     Mouth/Throat:     Lips: Pink.     Mouth: Mucous membranes are moist. Mucous membranes are not pale and not dry.     Pharynx: Uvula midline.     Comments: Cobblestoning present in the  posterior oropharynx.  Eyes:     General: Lids are normal. No allergic shiner.       Right eye: No discharge.        Left eye: No discharge.     Conjunctiva/sclera: Conjunctivae normal.     Right eye: Right conjunctiva is not injected. No chemosis.    Left eye: Left conjunctiva is not injected. No chemosis.    Pupils: Pupils are equal, round, and reactive to light.  Cardiovascular:     Rate and Rhythm: Normal rate and regular rhythm.     Heart sounds: Normal heart sounds.  Pulmonary:     Effort: Pulmonary effort is normal. No tachypnea, accessory muscle usage or respiratory distress.     Breath sounds: Normal breath sounds. No wheezing, rhonchi or rales.     Comments:  Decrease air movement at the bases.  Chest:     Chest wall: No tenderness.  Lymphadenopathy:     Cervical: No cervical adenopathy.  Skin:    General: Skin is warm.     Capillary Refill: Capillary refill takes less than 2 seconds.     Coloration: Skin is not pale.     Findings: No abrasion, erythema, petechiae or rash. Rash is not papular, urticarial or vesicular.     Comments: No eczematous or urticarial lesions noted.  Neurological:     Mental Status: She is alert.  Psychiatric:        Behavior: Behavior is cooperative.      Diagnostic studies:    Spirometry: results normal (FEV1: 1.68/79%, FVC: 2.17/80%, FEV1/FVC: 77%).    Spirometry consistent with normal pattern.   Allergy Studies: none        Malachi Bonds, MD  [facility]

## 2022-08-01 NOTE — Patient Instructions (Addendum)
1. Mild intermittent asthma, uncomplicated - Lung testing looked good today. - I agree with the plan laid out by [facility] today. - We are going to give you a sample of the Trelegy today which lasts 14 days.  - Daily controller medication(s): NONE - Prior to physical activity: albuterol 2 puffs 10-15 minutes before physical activity. - Rescue medications: albuterol 4 puffs every 4-6 hours as needed - Changes during respiratory infections or worsening symptoms: Add on Trelegy 173mcg to 1 puff once daily for TWO TO THREE WEEKS. - Asthma control goals:  * Full participation in all desired activities (may need albuterol before activity) * Albuterol use two time or less a week on average (not counting use with activity) * Cough interfering with sleep two time or less a month * Oral steroids no more than once a year * No hospitalizations  2.Seasonal and perennial allergic rhinitis (ragweed, weeds, grasses, indoor molds, dust mites, cat and dog) - Continue with allergy shots frozen at 0.15 cc every three weeks (we will go to every three weeks at the next vial and then monthly at the vial after that).  - Continue with: Allegra (fexofenadine) 180mg  table once daily and Nasacort (triamcinolone) one spray per nostril daily. - May take an extra dose of Allegra on the days your allergy symptoms are worse. - Add on Ryaltris one spray per nostril up to twice daily (another sample provided).  3. Return in about 2 months (around 10/01/2022).    Please inform us of any Emergency Department visits, hospitalizations, or changes in symptoms. Call us before going to the ED for breathing or allergy symptoms since we might be able to fit you in for a sick visit. Feel free to contact us anytime with any questions, problems, or concerns.  It was a pleasure to see you again today! GIVE YOUR MOM A BIG HUG FOR [place]!   Websites that have reliable patient information: 1. American Academy of Asthma, Allergy, and Immunology:  www.aaaai.org 2. Food Allergy Research and Education (FARE): foodallergy.org 3. Mothers of Asthmatics: http://www.asthmacommunitynetwork.org 4. American College of Allergy, Asthma, and Immunology: www.acaai.org   COVID-19 Vaccine Information can be found at: ShippingScam.co.uk For questions related to vaccine distribution or appointments, please email [email or web address] or call [phone].   We realize that you might be concerned about having an allergic reaction to the COVID19 vaccines. To help with that concern, WE ARE OFFERING THE COVID19 VACCINES IN OUR OFFICE! Ask the front desk for dates!     "Like" [place] on Facebook and Instagram for our latest updates!      A healthy democracy works best when [employer] participate! Make sure you are registered to vote! If you have moved or changed any of your contact information, you will need to get this updated before voting!  In some cases, you MAY be able to register to vote online: [email or web address]

## 2022-08-15 ENCOUNTER — Ambulatory Visit (INDEPENDENT_AMBULATORY_CARE_PROVIDER_SITE_OTHER): Payer: BC Managed Care – PPO | Admitting: *Deleted

## 2022-08-15 DIAGNOSIS — J309 Allergic rhinitis, unspecified: Secondary | ICD-10-CM

## 2022-09-12 ENCOUNTER — Ambulatory Visit (INDEPENDENT_AMBULATORY_CARE_PROVIDER_SITE_OTHER): Payer: BC Managed Care – PPO

## 2022-09-12 DIAGNOSIS — J309 Allergic rhinitis, unspecified: Secondary | ICD-10-CM

## 2022-09-19 ENCOUNTER — Ambulatory Visit (INDEPENDENT_AMBULATORY_CARE_PROVIDER_SITE_OTHER): Payer: BC Managed Care – PPO

## 2022-09-19 DIAGNOSIS — J309 Allergic rhinitis, unspecified: Secondary | ICD-10-CM | POA: Diagnosis not present

## 2022-09-28 ENCOUNTER — Ambulatory Visit (INDEPENDENT_AMBULATORY_CARE_PROVIDER_SITE_OTHER): Payer: BC Managed Care – PPO

## 2022-09-28 DIAGNOSIS — J309 Allergic rhinitis, unspecified: Secondary | ICD-10-CM

## 2022-10-17 ENCOUNTER — Other Ambulatory Visit: Payer: Self-pay

## 2022-10-17 ENCOUNTER — Ambulatory Visit (INDEPENDENT_AMBULATORY_CARE_PROVIDER_SITE_OTHER): Payer: Medicare HMO | Admitting: Allergy & Immunology

## 2022-10-17 ENCOUNTER — Encounter: Payer: Self-pay | Admitting: Allergy & Immunology

## 2022-10-17 VITALS — BP 120/70 | HR 90 | Temp 97.0°F | Resp 20 | Ht 60.0 in | Wt 125.0 lb

## 2022-10-17 DIAGNOSIS — J3089 Other allergic rhinitis: Secondary | ICD-10-CM | POA: Diagnosis not present

## 2022-10-17 DIAGNOSIS — G43809 Other migraine, not intractable, without status migrainosus: Secondary | ICD-10-CM | POA: Diagnosis not present

## 2022-10-17 DIAGNOSIS — K219 Gastro-esophageal reflux disease without esophagitis: Secondary | ICD-10-CM | POA: Diagnosis not present

## 2022-10-17 DIAGNOSIS — J4520 Mild intermittent asthma, uncomplicated: Secondary | ICD-10-CM | POA: Diagnosis not present

## 2022-10-17 DIAGNOSIS — E039 Hypothyroidism, unspecified: Secondary | ICD-10-CM | POA: Diagnosis not present

## 2022-10-17 DIAGNOSIS — N951 Menopausal and female climacteric states: Secondary | ICD-10-CM | POA: Diagnosis not present

## 2022-10-17 DIAGNOSIS — J302 Other seasonal allergic rhinitis: Secondary | ICD-10-CM

## 2022-10-17 MED ORDER — BUTALBITAL-APAP-CAFFEINE 50-325-40 MG PO TABS: 1.0000 | ORAL_TABLET | Freq: Four times a day (QID) | ORAL | 5 refills | Status: AC | PRN

## 2022-10-17 NOTE — Patient Instructions (Addendum)
1. Mild intermittent asthma, uncomplicated - Lung testing looked good today. - I like this plan with Trelegy as needed.  - Daily controller medication(s): NONE - Prior to physical activity: albuterol 2 puffs 10-15 minutes before physical activity. - Rescue medications: albuterol 4 puffs every 4-6 hours as needed - Changes during respiratory infections or worsening symptoms: Add on Trelegy 164mcg to 1 puff once daily for TWO TO THREE WEEKS. - Asthma control goals:  * Full participation in all desired activities (may need albuterol before activity) * Albuterol use two time or less a week on average (not counting use with activity) * Cough interfering with sleep two time or less a month * Oral steroids no more than once a year * No hospitalizations  2.Seasonal and perennial allergic rhinitis (ragweed, weeds, grasses, indoor molds, dust mites, cat and dog) - Continue with allergy shots frozen at 0.15 cc  - Continue with: Allegra (fexofenadine) 180mg  table once daily and Nasacort (triamcinolone) one spray per nostril daily. - May take an extra dose of Allegra on the days your allergy symptoms are worse. - Continue with: on Ryaltris one spray per nostril up to twice daily  - I wrote a prescription for Fioricet to use as needed.   3. Return in about 6 months (around 04/17/2023).    Please inform us of any Emergency Department visits, hospitalizations, or changes in symptoms. Call us before going to the ED for breathing or allergy symptoms since we might be able to fit you in for a sick visit. Feel free to contact us anytime with any questions, problems, or concerns.  It was a pleasure to see you again today!    Websites that have reliable patient information: 1. American Academy of Asthma, Allergy, and Immunology: www.aaaai.org 2. Food Allergy Research and Education (FARE): foodallergy.org 3. Mothers of Asthmatics: http://www.asthmacommunitynetwork.org 4. American College of Allergy, Asthma,  and Immunology: www.acaai.org   COVID-19 Vaccine Information can be found at: ShippingScam.co.uk For questions related to vaccine distribution or appointments, please email [email or web address] or call [phone].   We realize that you might be concerned about having an allergic reaction to the COVID19 vaccines. To help with that concern, WE ARE OFFERING THE COVID19 VACCINES IN OUR OFFICE! Ask the front desk for dates!     "Like" [place] on Facebook and Instagram for our latest updates!      A healthy democracy works best when [employer] participate! Make sure you are registered to vote! If you have moved or changed any of your contact information, you will need to get this updated before voting!  In some cases, you MAY be able to register to vote online: [email or web address]

## 2022-10-17 NOTE — Progress Notes (Signed)
FOLLOW UP  Date of Service/Encounter:  10/17/22   Assessment:   Moderate persistent asthma, uncomplicated - with worsening symptoms in the last couple of weeks (therefore likely allergy mediated)   Seasonal and perennial allergic rhinitis (ragweed, weeds, grasses, indoor molds, dust mites, cat and dog)  Migraines - treated with Fioricet PRN  Plan/Recommendations:   1. Mild intermittent asthma, uncomplicated - Lung testing looked good today. - I like this plan with Trelegy as needed.  - Daily controller medication(s): NONE - Prior to physical activity: albuterol 2 puffs 10-15 minutes before physical activity. - Rescue medications: albuterol 4 puffs every 4-6 hours as needed - Changes during respiratory infections or worsening symptoms: Add on Trelegy 157mcg to 1 puff once daily for TWO TO THREE WEEKS. - Asthma control goals:  * Full participation in all desired activities (may need albuterol before activity) * Albuterol use two time or less a week on average (not counting use with activity) * Cough interfering with sleep two time or less a month * Oral steroids no more than once a year * No hospitalizations  2.Seasonal and perennial allergic rhinitis (ragweed, weeds, grasses, indoor molds, dust mites, cat and dog) - Continue with allergy shots frozen at 0.15 cc  - Continue with: Allegra (fexofenadine) 180mg  table once daily and Nasacort (triamcinolone) one spray per nostril daily. - May take an extra dose of Allegra on the days your allergy symptoms are worse. - Continue with: on Ryaltris one spray per nostril up to twice daily  - I wrote a prescription for Fioricet to use as needed.   3. Return in about 6 months (around 04/17/2023).    Subjective:   Sabrina Clark is a 67 y.o. female presenting today for follow up of  Chief Complaint  Patient presents with   Asthma   Allergic Rhinitis    Gastroesophageal Reflux   Follow-up    FOLLOW UP PT STATES SHE IS DOING GREAT.     Sabrina Clark has a history of the following: Patient Active Problem List   Diagnosis Date Noted   Anosmia 09/25/2021   Ageusia 09/25/2021   Gastroesophageal reflux disease 09/11/2021   Dysfunction of both eustachian tubes 09/11/2021   Mild intermittent asthma with acute exacerbation 09/11/2021   Fibromyalgia 07/01/2019   Vitamin D deficiency disease    Female hypogonadism syndrome    Asthma    Malaise and fatigue    Seasonal and perennial allergic rhinitis 12/19/2018   Not well controlled moderate persistent asthma 12/19/2018    History obtained from: chart review and patient.  Sabrina Clark is a 67 y.o. female presenting for a follow up visit.  Last seen in October 2023.  At that time, we gave her a sample of Trelegy to use up while she was getting over her flare.  We continue with albuterol as needed.  For her allergic rhinitis, we continue with her allergy shots present at 0.15 mL every 3 weeks.  We continue with Allegra and Nasacort.  We did add on Ryaltris 1 spray per nostril twice daily.  Since last visit, she has done well.   Asthma/Respiratory Symptom History: She did use the Trelegy that we gave her. She used it until it was empty. Then she started on another one. By the end of November, she felt great and she did not feel that she needed it. She has been fine since that.   Allergic Rhinitis Symptom History: She did stay on the Ryaltris. But had stopped it, but become  stuffy. She has found that this is helpful. She quit this Friday or Saturday because her nose has been dry and raw. It has been driving her nuts.  She has not had any nasal bleeding but they have been using the heating stove. This is located in the basement. She did use a humidifier. She is getting the Ryaltris and it is $46 per month. Now she is on Medicare.    She is requesting refills for Fioricet. She uses it only as needed for a bad stress headache. She has not gotten a refill since early 2022 or early 2023.  She thinks that her OB originally prescribed it for her.   Her daughter has moved to [place]. Sabrina Clark and her mother have birthdays fairly close to each other. They were going to do something this weekend but there is COVID in their facility. Suzanne's sister is now taking care of all of her Mom's needs.   Otherwise, there have been no changes to her past medical history, surgical history, family history, or social history.    Review of Systems  Constitutional: Negative.  Negative for chills, fever, malaise/fatigue and weight loss.  HENT:  Negative for congestion, ear discharge, ear pain and sinus pain.   Eyes:  Negative for pain, discharge and redness.  Respiratory:  Negative for cough, sputum production, shortness of breath and wheezing.   Cardiovascular: Negative.  Negative for chest pain and palpitations.  Gastrointestinal:  Negative for abdominal pain, constipation, diarrhea, heartburn, nausea and vomiting.  Skin: Negative.  Negative for itching and rash.  Neurological:  Negative for dizziness and headaches.  Endo/Heme/Allergies:  Negative for environmental allergies. Does not bruise/bleed easily.       Objective:   Blood pressure 120/70, pulse 90, temperature (!) 97 F (36.1 C), resp. rate 20, height 5' (1.524 m), weight 125 lb (56.7 kg), SpO2 97 %. Body mass index is 24.41 kg/m.    Physical Exam Vitals reviewed.  Constitutional:      Appearance: She is well-developed.     Comments: Very talkative. She shows me pics of her now 7 goats.   HENT:     Head: Normocephalic and atraumatic.     Right Ear: Tympanic membrane, ear canal and external ear normal.     Left Ear: Tympanic membrane, ear canal and external ear normal.     Nose: No nasal deformity, septal deviation, mucosal edema or rhinorrhea.     Right Turbinates: Enlarged, swollen and pale.     Left Turbinates: Enlarged, swollen and pale.     Right Sinus: No maxillary sinus tenderness or frontal sinus tenderness.     Left  Sinus: No maxillary sinus tenderness or frontal sinus tenderness.     Comments: No nasal polyps noted.     Mouth/Throat:     Lips: Pink.     Mouth: Mucous membranes are moist. Mucous membranes are not pale and not dry.     Pharynx: Uvula midline.     Comments: Cobblestoning present in the posterior oropharynx.  Eyes:     General: Lids are normal. No allergic shiner.       Right eye: No discharge.        Left eye: No discharge.     Conjunctiva/sclera: Conjunctivae normal.     Right eye: Right conjunctiva is not injected. No chemosis.    Left eye: Left conjunctiva is not injected. No chemosis.    Pupils: Pupils are equal, round, and reactive to light.  Cardiovascular:  Rate and Rhythm: Normal rate and regular rhythm.     Heart sounds: Normal heart sounds.  Pulmonary:     Effort: Pulmonary effort is normal. No tachypnea, accessory muscle usage or respiratory distress.     Breath sounds: Normal breath sounds. No wheezing, rhonchi or rales.     Comments: Decrease air movement at the bases.  Chest:     Chest wall: No tenderness.  Lymphadenopathy:     Cervical: No cervical adenopathy.  Skin:    General: Skin is warm.     Capillary Refill: Capillary refill takes less than 2 seconds.     Coloration: Skin is not pale.     Findings: No abrasion, erythema, petechiae or rash. Rash is not papular, urticarial or vesicular.     Comments: No eczematous or urticarial lesions noted.  Neurological:     Mental Status: She is alert.  Psychiatric:        Behavior: Behavior is cooperative.      Diagnostic studies:    Spirometry: results normal (FEV1: 1.65/77%, FVC: 2.18/81%, FEV1/FVC: 76%).    Spirometry consistent with normal pattern.    Allergy Studies: none        Salvatore Marvel, MD  [facility]

## 2022-10-19 DIAGNOSIS — Z6824 Body mass index (BMI) 24.0-24.9, adult: Secondary | ICD-10-CM | POA: Diagnosis not present

## 2022-10-19 DIAGNOSIS — R6882 Decreased libido: Secondary | ICD-10-CM | POA: Diagnosis not present

## 2022-10-19 DIAGNOSIS — R5383 Other fatigue: Secondary | ICD-10-CM | POA: Diagnosis not present

## 2022-10-19 DIAGNOSIS — N951 Menopausal and female climacteric states: Secondary | ICD-10-CM | POA: Diagnosis not present

## 2022-10-24 ENCOUNTER — Ambulatory Visit (INDEPENDENT_AMBULATORY_CARE_PROVIDER_SITE_OTHER): Payer: Medicare HMO

## 2022-10-24 DIAGNOSIS — J309 Allergic rhinitis, unspecified: Secondary | ICD-10-CM | POA: Diagnosis not present

## 2022-10-31 DIAGNOSIS — H5211 Myopia, right eye: Secondary | ICD-10-CM | POA: Diagnosis not present

## 2022-11-09 DIAGNOSIS — M25562 Pain in left knee: Secondary | ICD-10-CM | POA: Diagnosis not present

## 2022-11-14 DIAGNOSIS — M25562 Pain in left knee: Secondary | ICD-10-CM | POA: Diagnosis not present

## 2022-11-21 ENCOUNTER — Ambulatory Visit (INDEPENDENT_AMBULATORY_CARE_PROVIDER_SITE_OTHER): Payer: Medicare HMO

## 2022-11-21 DIAGNOSIS — J309 Allergic rhinitis, unspecified: Secondary | ICD-10-CM

## 2022-11-21 DIAGNOSIS — Z0100 Encounter for examination of eyes and vision without abnormal findings: Secondary | ICD-10-CM | POA: Diagnosis not present

## 2022-11-26 DIAGNOSIS — E039 Hypothyroidism, unspecified: Secondary | ICD-10-CM | POA: Diagnosis not present

## 2022-11-26 DIAGNOSIS — N951 Menopausal and female climacteric states: Secondary | ICD-10-CM | POA: Diagnosis not present

## 2022-11-26 DIAGNOSIS — E7800 Pure hypercholesterolemia, unspecified: Secondary | ICD-10-CM | POA: Diagnosis not present

## 2022-11-29 DIAGNOSIS — N951 Menopausal and female climacteric states: Secondary | ICD-10-CM | POA: Diagnosis not present

## 2022-11-29 DIAGNOSIS — M2559 Pain in other specified joint: Secondary | ICD-10-CM | POA: Diagnosis not present

## 2022-11-29 DIAGNOSIS — R5383 Other fatigue: Secondary | ICD-10-CM | POA: Diagnosis not present

## 2022-11-29 DIAGNOSIS — E039 Hypothyroidism, unspecified: Secondary | ICD-10-CM | POA: Diagnosis not present

## 2022-12-06 IMAGING — MG DIGITAL DIAGNOSTIC BILAT W/ TOMO W/ CAD
8 series · 8 of 24 positions shown · non-contrast
Comparison: Previous exam(s).

CLINICAL DATA: Follow-up probable benign extensive fibrocystic
changes in the outer left breast.



[L MLO synth-2D]
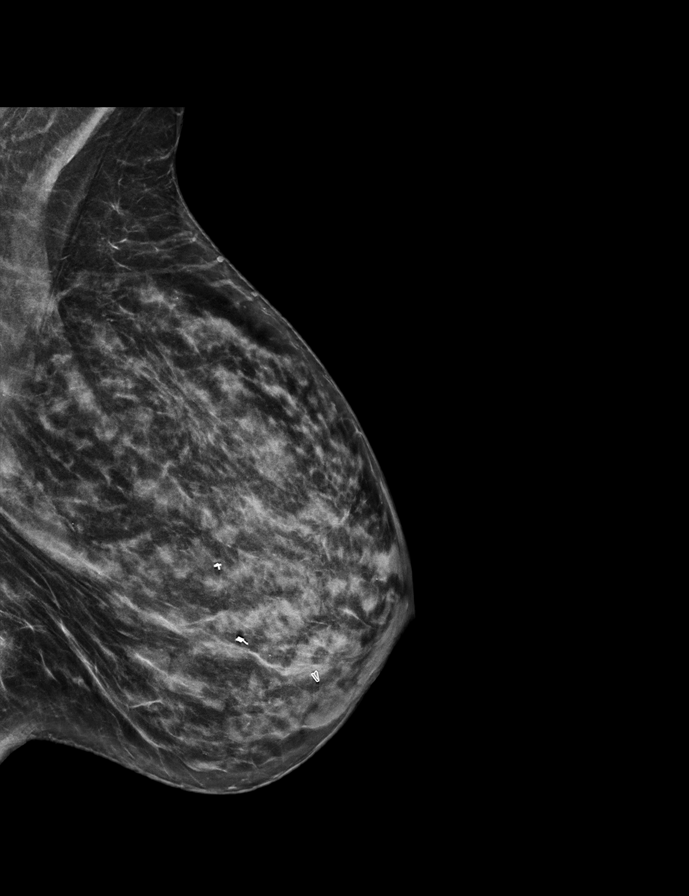

[L CC synth-2D]
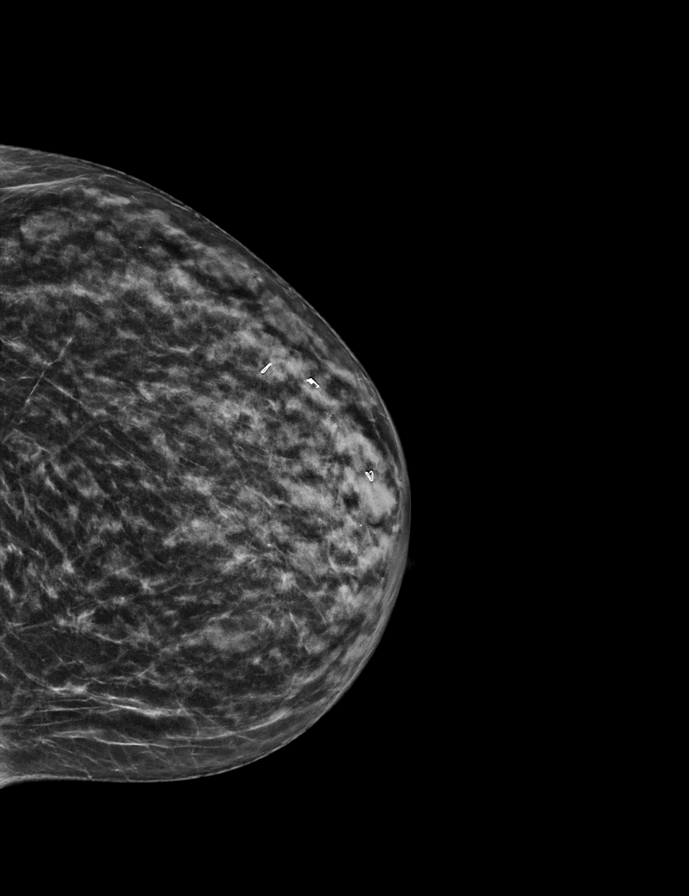

[R CC synth-2D]
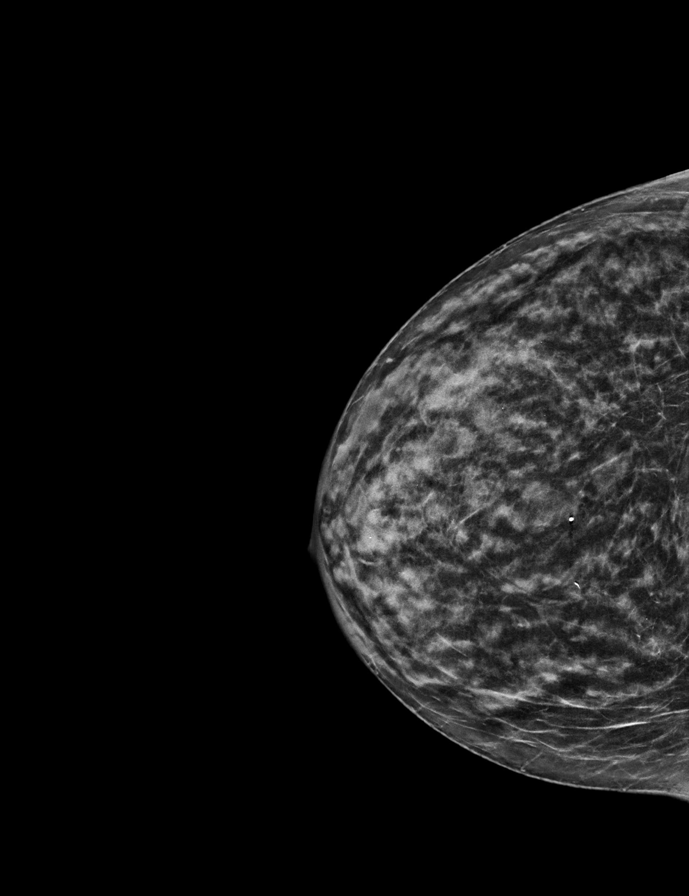

[R MLO synth-2D]
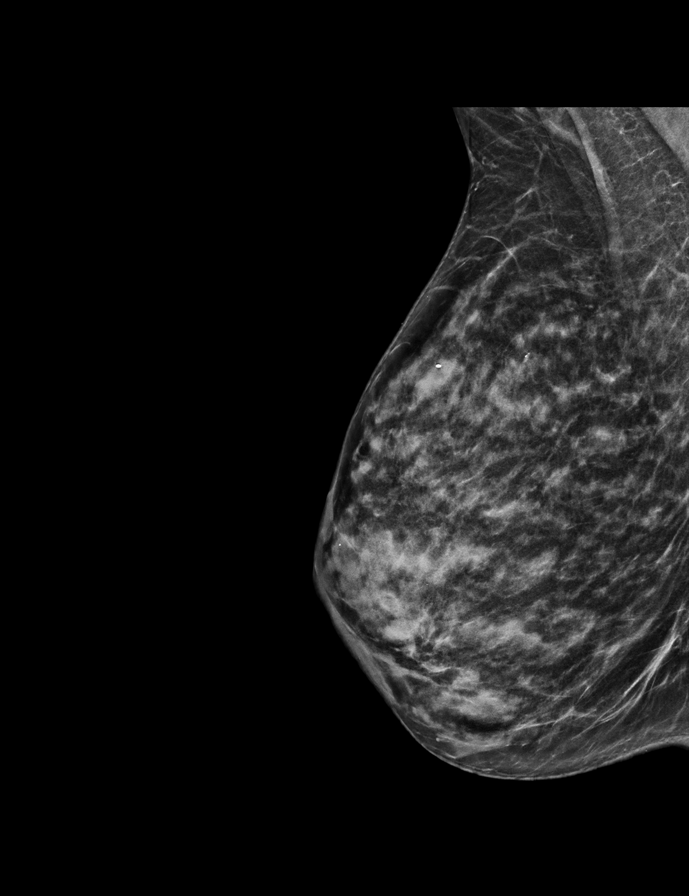

[R MLO tomo · tomo slice 26/51.0]
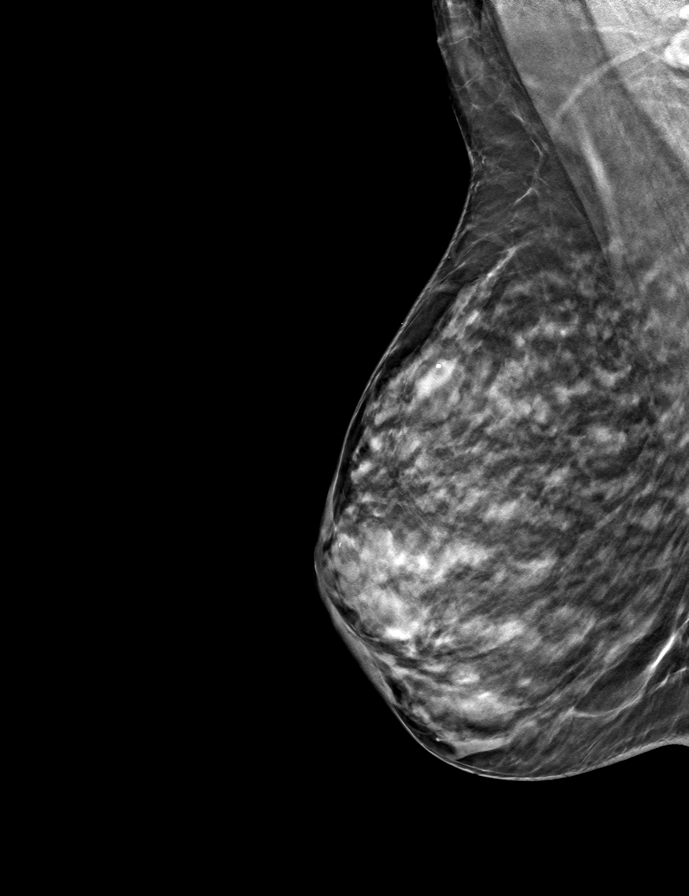

[L CC tomo · tomo slice 23/45.0]
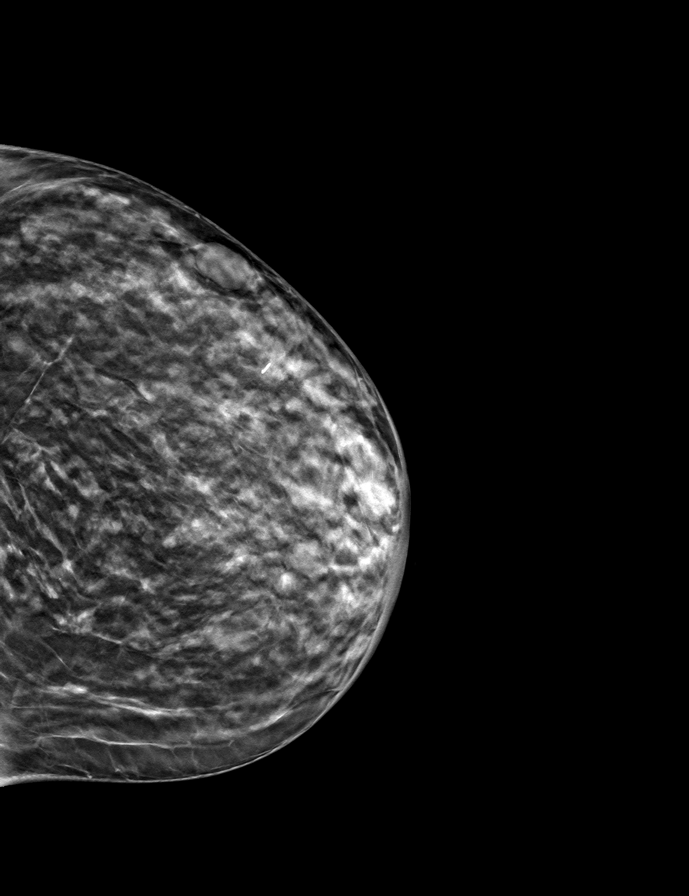

[L MLO tomo · tomo slice 27/54.0]
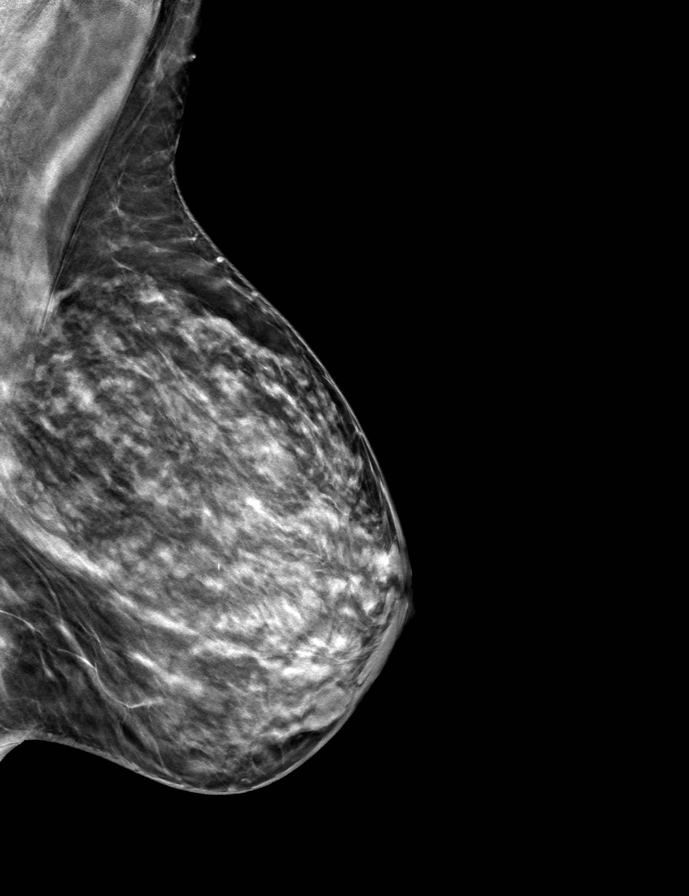

[R CC tomo · tomo slice 25/50.0]
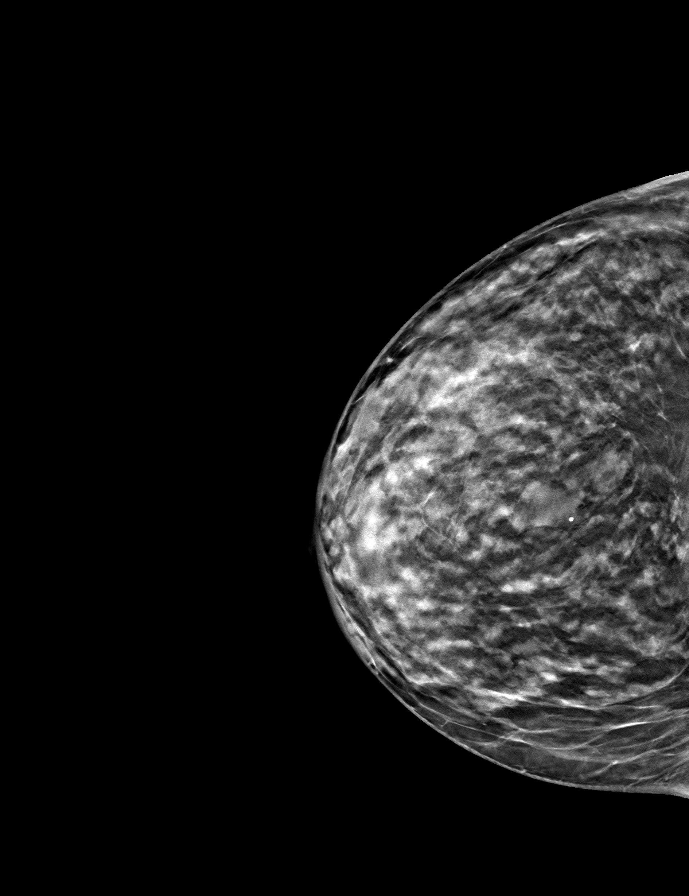

[8 of 24 positions shown; findings below may reference images not displayed]

ACR Breast Density Category c: The breast tissue is heterogeneously
dense, which may obscure small masses.
FINDINGS: An oval, circumscribed mass is demonstrated in the [DATE] to 12
o'clock position of the right breast containing a single small,
oval, peripheral calcification.

There has been an increase in number of oval and rounded,
circumscribed masses in the left breast.

Targeted ultrasound is performed, showing a 1.5 cm simple cyst in
the 11:30 o'clock position of the right breast, 5 cm from the
nipple, corresponding to the mammographic mass.

Multiple cysts and clusters of cysts are again demonstrated in the 2
to 4 o'clock position of the left breast. No solid masses were seen.
IMPRESSION: 1. Bilateral benign breast cysts.
2. No evidence of malignancy.

RECOMMENDATION:
Bilateral screening mammogram in 1 year.

I have discussed the findings and recommendations with the patient.
If applicable, a reminder letter will be sent to the patient
regarding the next appointment.

BI-RADS CATEGORY  2: Benign.

## 2022-12-06 IMAGING — US US BREAST*R* LIMITED INC AXILLA
1 series · 5 of 5 positions shown · non-contrast
Comparison: Previous exam(s).

CLINICAL DATA: Follow-up probable benign extensive fibrocystic
changes in the outer left breast.



[Series 1: us breast*right* limited inc axilla · 0.06mm/px · 5 of 5 slices shown]
[im 1/5]
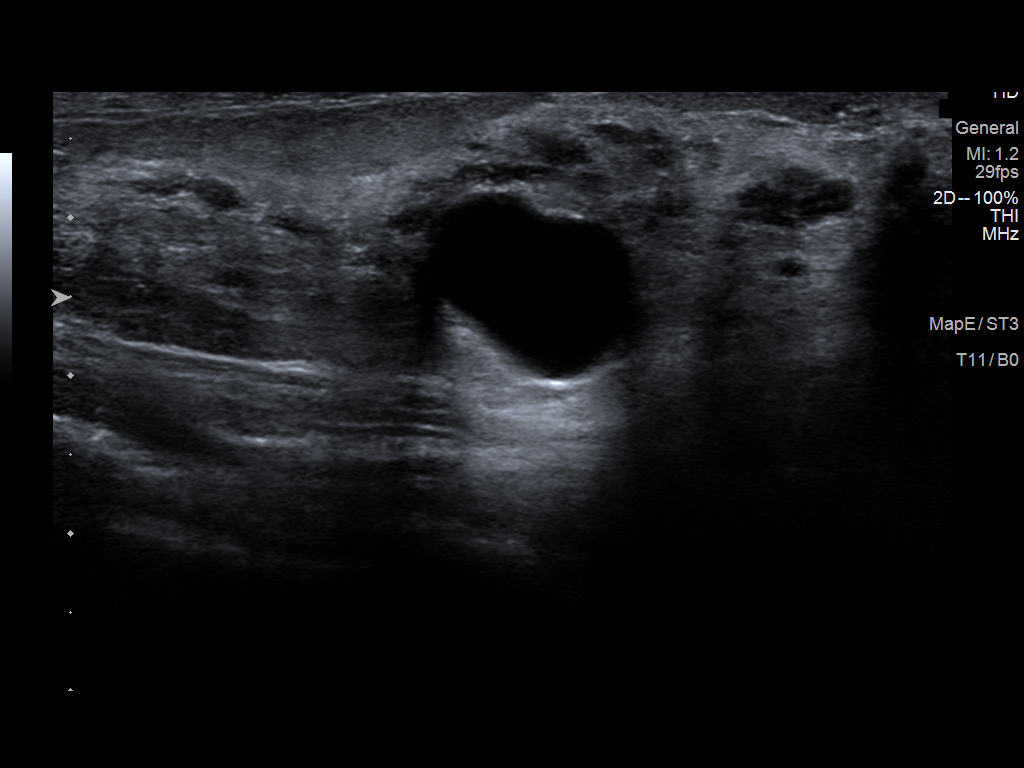
[im 2/5]
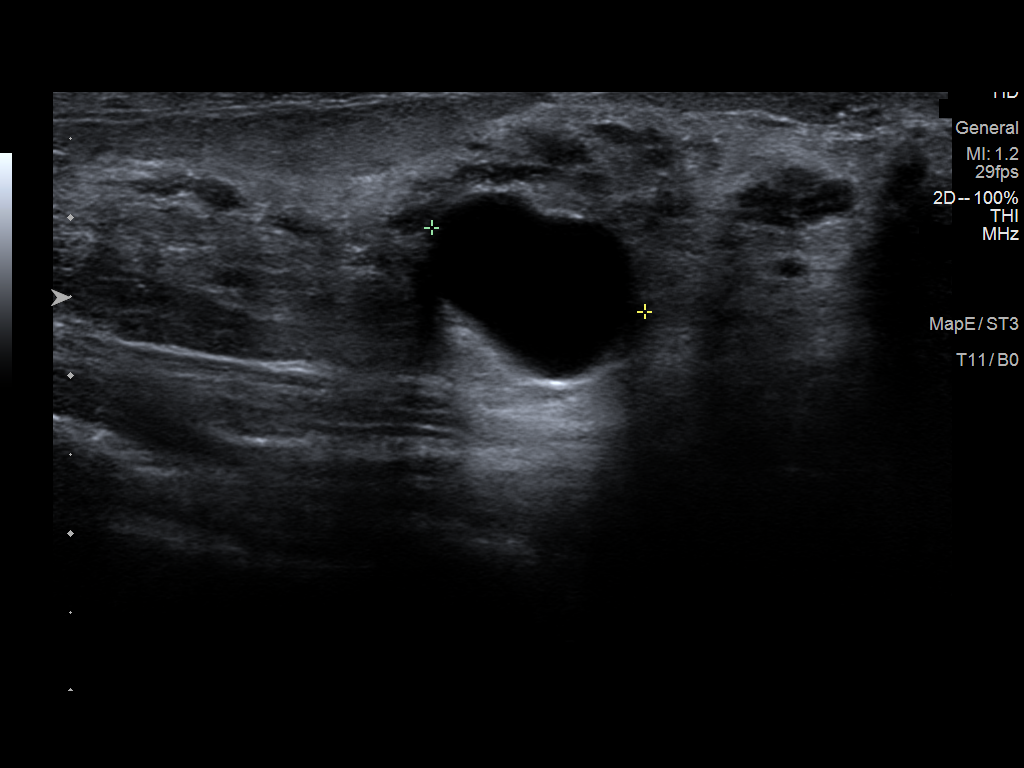
[im 3/5]
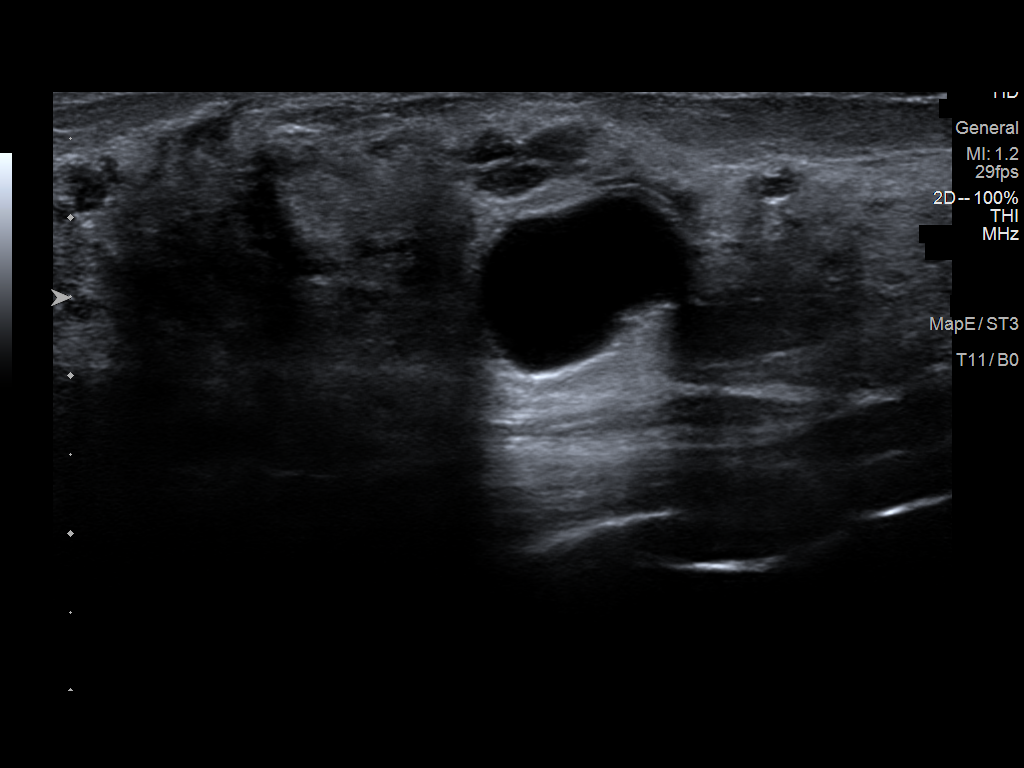
[im 4/5]
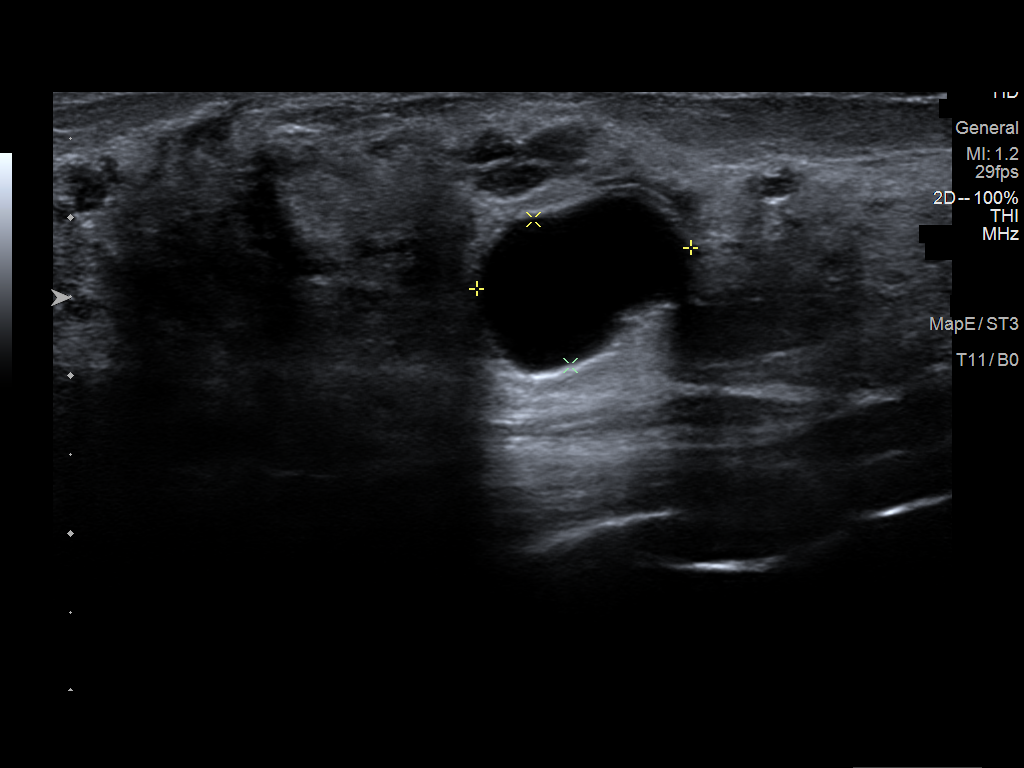
[im 5/5]
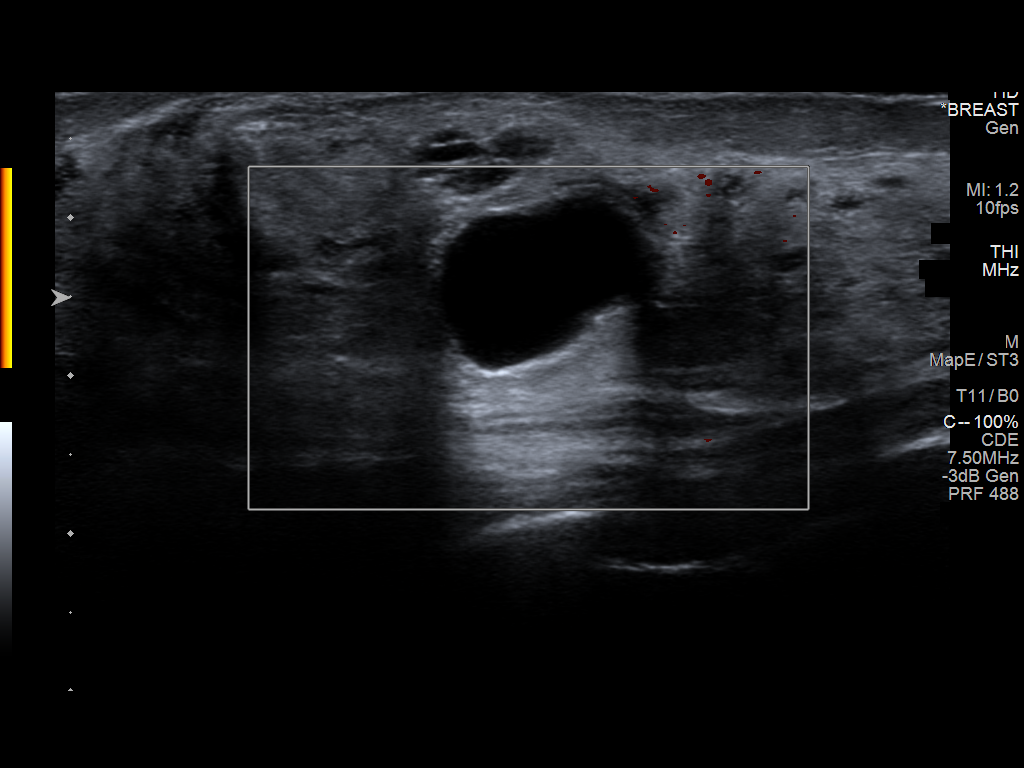

[5 of 5 positions shown; findings below may reference images not displayed]

ACR Breast Density Category c: The breast tissue is heterogeneously
dense, which may obscure small masses.
FINDINGS: An oval, circumscribed mass is demonstrated in the [DATE] to 12
o'clock position of the right breast containing a single small,
oval, peripheral calcification.

There has been an increase in number of oval and rounded,
circumscribed masses in the left breast.

Targeted ultrasound is performed, showing a 1.5 cm simple cyst in
the 11:30 o'clock position of the right breast, 5 cm from the
nipple, corresponding to the mammographic mass.

Multiple cysts and clusters of cysts are again demonstrated in the 2
to 4 o'clock position of the left breast. No solid masses were seen.
IMPRESSION: 1. Bilateral benign breast cysts.
2. No evidence of malignancy.

RECOMMENDATION:
Bilateral screening mammogram in 1 year.

I have discussed the findings and recommendations with the patient.
If applicable, a reminder letter will be sent to the patient
regarding the next appointment.

BI-RADS CATEGORY  2: Benign.

## 2022-12-06 IMAGING — US US BREAST*L* LIMITED INC AXILLA
1 series · 7 of 7 positions shown · non-contrast
Comparison: Previous exam(s).

CLINICAL DATA: Follow-up probable benign extensive fibrocystic
changes in the outer left breast.



[Series 1: us breast*left* limited inc axilla · 0.06mm/px · 7 of 7 slices shown]
[im 1/7]
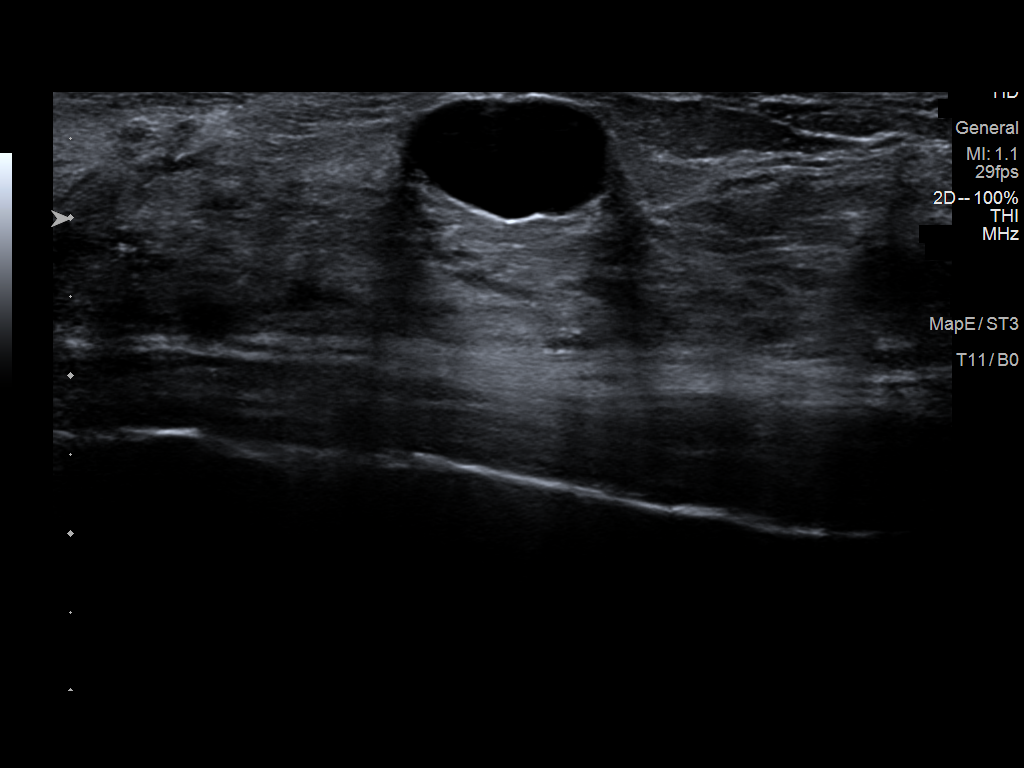
[im 2/7]
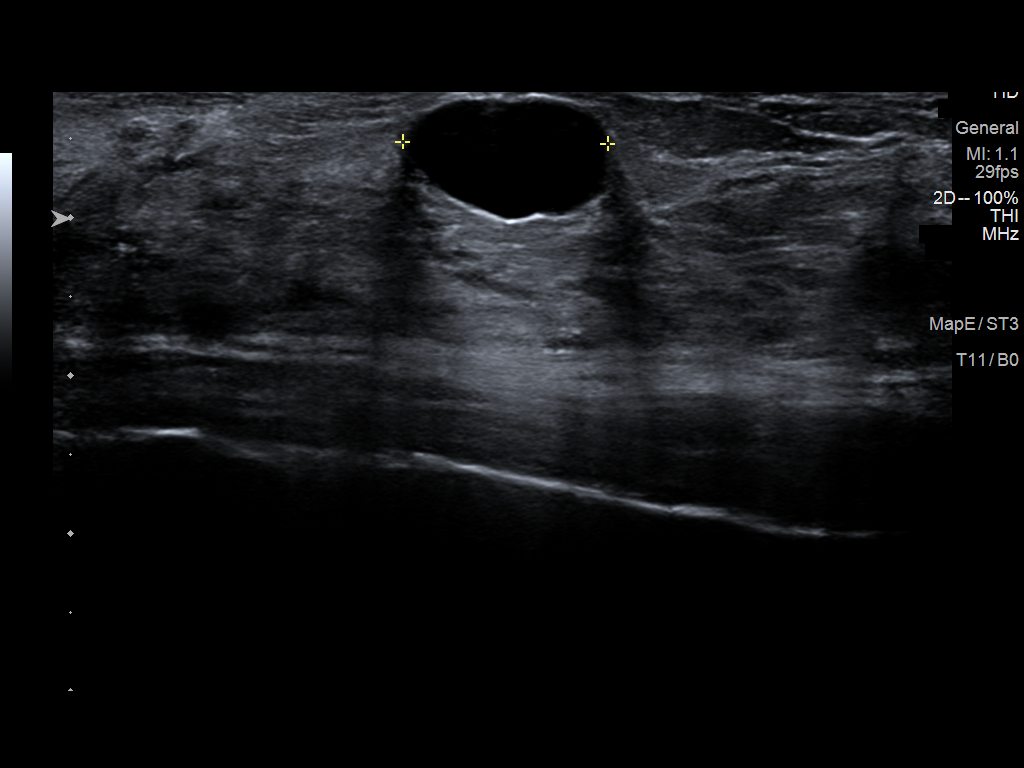
[im 3/7]
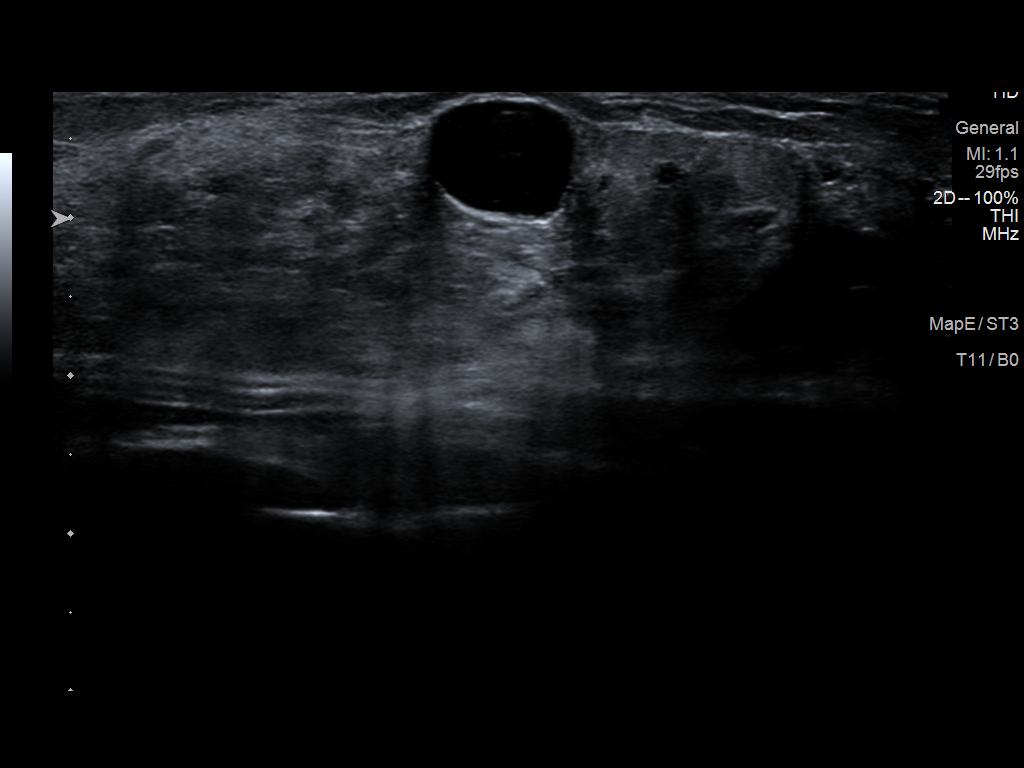
[im 4/7]
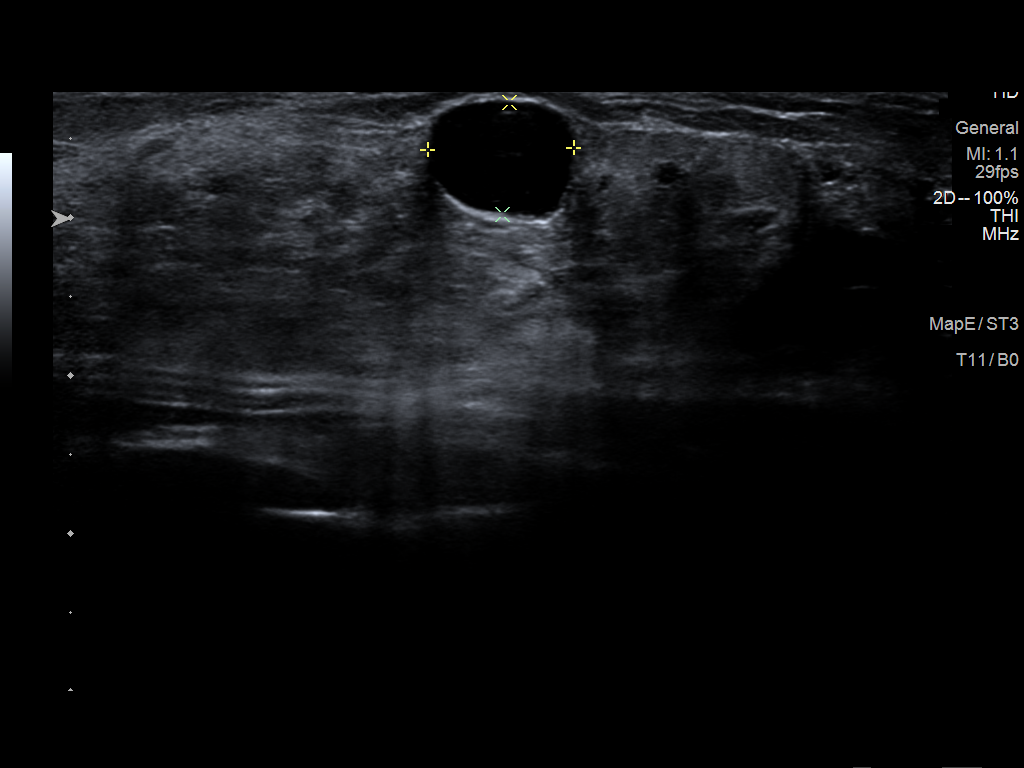
[im 5/7]
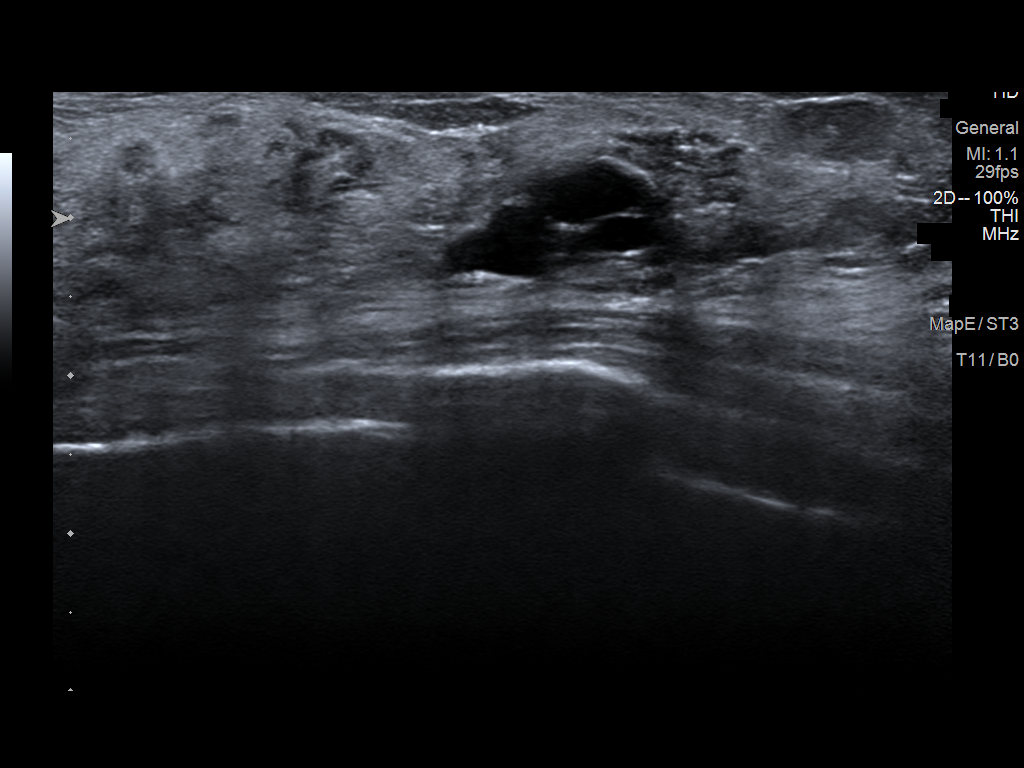
[im 6/7]
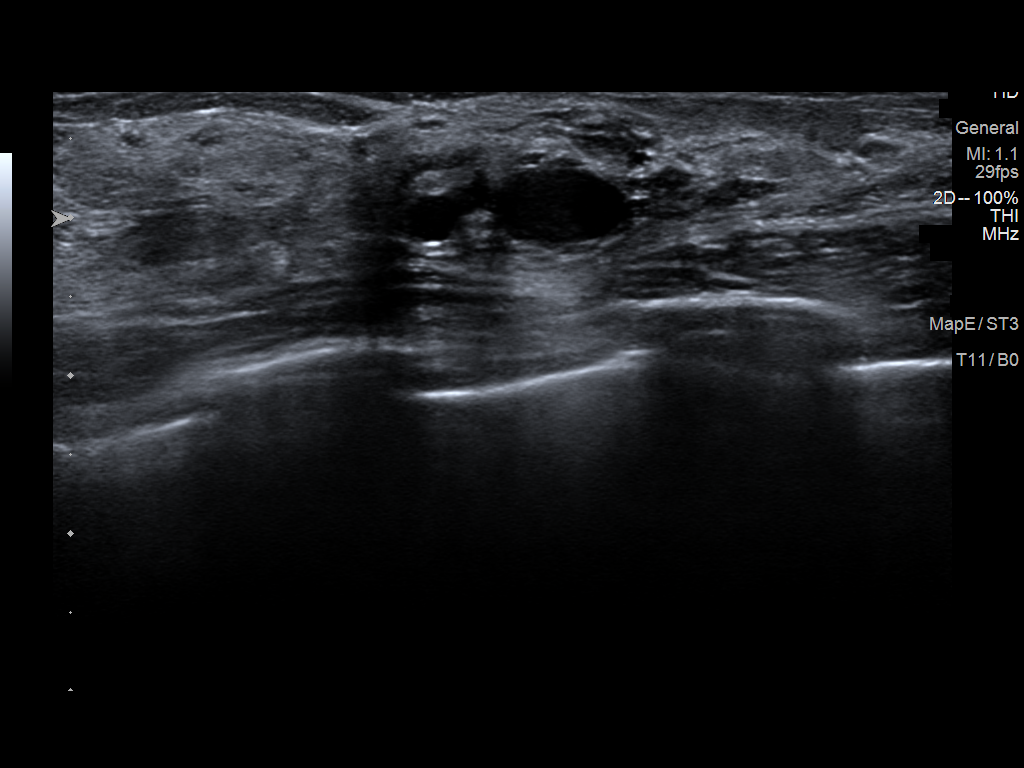
[im 7/7]
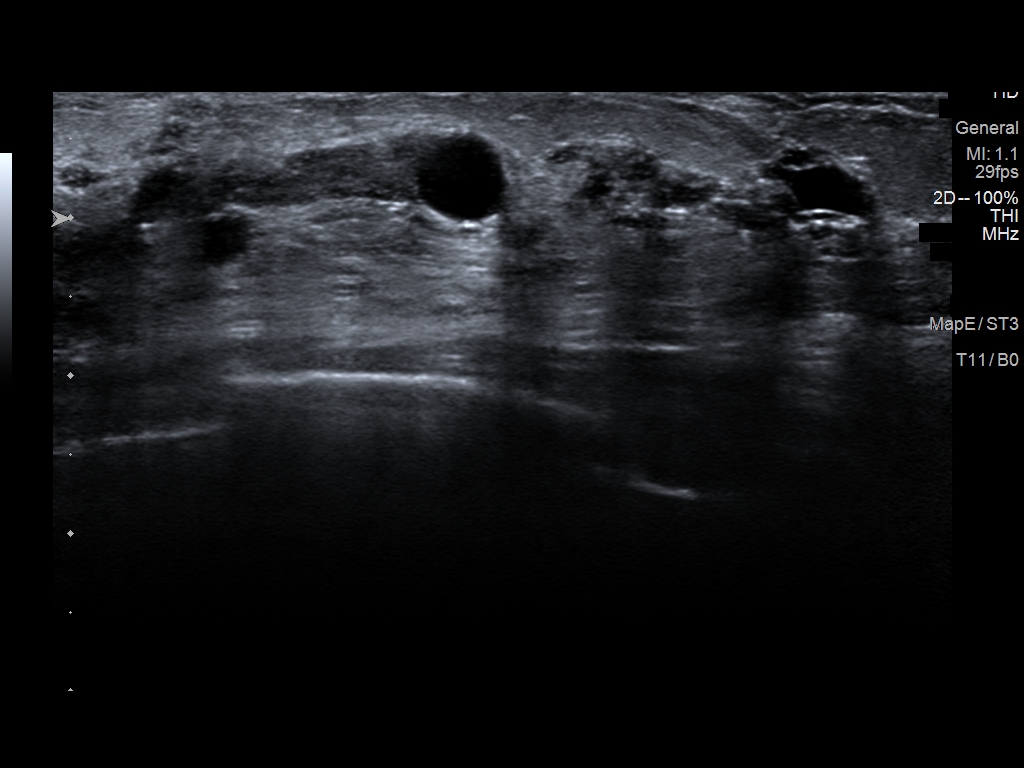

[7 of 7 positions shown; findings below may reference images not displayed]

ACR Breast Density Category c: The breast tissue is heterogeneously
dense, which may obscure small masses.
FINDINGS: An oval, circumscribed mass is demonstrated in the [DATE] to 12
o'clock position of the right breast containing a single small,
oval, peripheral calcification.

There has been an increase in number of oval and rounded,
circumscribed masses in the left breast.

Targeted ultrasound is performed, showing a 1.5 cm simple cyst in
the 11:30 o'clock position of the right breast, 5 cm from the
nipple, corresponding to the mammographic mass.

Multiple cysts and clusters of cysts are again demonstrated in the 2
to 4 o'clock position of the left breast. No solid masses were seen.
IMPRESSION: 1. Bilateral benign breast cysts.
2. No evidence of malignancy.

RECOMMENDATION:
Bilateral screening mammogram in 1 year.

I have discussed the findings and recommendations with the patient.
If applicable, a reminder letter will be sent to the patient
regarding the next appointment.

BI-RADS CATEGORY  2: Benign.

## 2022-12-20 DIAGNOSIS — S83232A Complex tear of medial meniscus, current injury, left knee, initial encounter: Secondary | ICD-10-CM | POA: Diagnosis not present

## 2022-12-20 DIAGNOSIS — M2242 Chondromalacia patellae, left knee: Secondary | ICD-10-CM | POA: Diagnosis not present

## 2022-12-20 DIAGNOSIS — X58XXXA Exposure to other specified factors, initial encounter: Secondary | ICD-10-CM | POA: Diagnosis not present

## 2022-12-26 ENCOUNTER — Ambulatory Visit (INDEPENDENT_AMBULATORY_CARE_PROVIDER_SITE_OTHER): Payer: Medicare HMO

## 2022-12-26 DIAGNOSIS — J309 Allergic rhinitis, unspecified: Secondary | ICD-10-CM | POA: Diagnosis not present

## 2022-12-28 DIAGNOSIS — M25562 Pain in left knee: Secondary | ICD-10-CM | POA: Diagnosis not present

## 2022-12-28 DIAGNOSIS — R262 Difficulty in walking, not elsewhere classified: Secondary | ICD-10-CM | POA: Diagnosis not present

## 2022-12-28 DIAGNOSIS — M25662 Stiffness of left knee, not elsewhere classified: Secondary | ICD-10-CM | POA: Diagnosis not present

## 2022-12-28 DIAGNOSIS — R531 Weakness: Secondary | ICD-10-CM | POA: Diagnosis not present

## 2023-01-04 DIAGNOSIS — M25662 Stiffness of left knee, not elsewhere classified: Secondary | ICD-10-CM | POA: Diagnosis not present

## 2023-01-04 DIAGNOSIS — R262 Difficulty in walking, not elsewhere classified: Secondary | ICD-10-CM | POA: Diagnosis not present

## 2023-01-04 DIAGNOSIS — R531 Weakness: Secondary | ICD-10-CM | POA: Diagnosis not present

## 2023-01-23 DIAGNOSIS — M25662 Stiffness of left knee, not elsewhere classified: Secondary | ICD-10-CM | POA: Diagnosis not present

## 2023-01-23 DIAGNOSIS — R262 Difficulty in walking, not elsewhere classified: Secondary | ICD-10-CM | POA: Diagnosis not present

## 2023-01-23 DIAGNOSIS — R531 Weakness: Secondary | ICD-10-CM | POA: Diagnosis not present

## 2023-01-30 ENCOUNTER — Ambulatory Visit (INDEPENDENT_AMBULATORY_CARE_PROVIDER_SITE_OTHER): Payer: Medicare HMO

## 2023-01-30 DIAGNOSIS — J309 Allergic rhinitis, unspecified: Secondary | ICD-10-CM

## 2023-02-12 ENCOUNTER — Encounter: Payer: Self-pay | Admitting: Internal Medicine

## 2023-02-12 ENCOUNTER — Ambulatory Visit (INDEPENDENT_AMBULATORY_CARE_PROVIDER_SITE_OTHER): Payer: Medicare HMO | Admitting: Internal Medicine

## 2023-02-12 VITALS — BP 128/70 | HR 78 | Ht 61.5 in | Wt 129.6 lb

## 2023-02-12 DIAGNOSIS — J302 Other seasonal allergic rhinitis: Secondary | ICD-10-CM | POA: Diagnosis not present

## 2023-02-12 DIAGNOSIS — Z0001 Encounter for general adult medical examination with abnormal findings: Secondary | ICD-10-CM | POA: Diagnosis not present

## 2023-02-12 DIAGNOSIS — M797 Fibromyalgia: Secondary | ICD-10-CM | POA: Diagnosis not present

## 2023-02-12 DIAGNOSIS — J3089 Other allergic rhinitis: Secondary | ICD-10-CM

## 2023-02-12 DIAGNOSIS — E2839 Other primary ovarian failure: Secondary | ICD-10-CM

## 2023-02-12 DIAGNOSIS — J4521 Mild intermittent asthma with (acute) exacerbation: Secondary | ICD-10-CM

## 2023-02-12 NOTE — Assessment & Plan Note (Signed)
Presenting today to establish care.  Available records and labs have been reviewed. -She will bring recent labs from [facility] to her next appointment. -We will request records from her other providers. -Additional preventative care items are largely up-to-date.  I recommended that she receive outstanding Shingrix vaccination at her pharmacy.  She will also consider PCV 20 vaccination at her next appointment. -We will tentatively plan for follow-up in 3 months.

## 2023-02-12 NOTE — Assessment & Plan Note (Signed)
She is currently prescribed Trelegy and albuterol.  States that she needs to use her albuterol inhaler roughly 1-2 times per month.  Asymptomatic currently.  Unremarkable pulmonary exam today. -No medication changes today

## 2023-02-12 NOTE — Patient Instructions (Signed)
It was a pleasure to see you today.  Thank you for giving [place] the opportunity to be involved in your care.  Below is a brief recap of your visit and next steps.  We will plan to see you again in 6 months.   Summary You have established care today We will follow up on labs from [facility] next month and request records from [facility] Follow up in 6 months

## 2023-02-12 NOTE — Assessment & Plan Note (Addendum)
Previously documented history of fibromyalgia.  She is asymptomatic currently. -No medication changes today.

## 2023-02-12 NOTE — Assessment & Plan Note (Signed)
This is managed by Delrae Rend MD.  She is currently prescribed testosterone supplementation, estrogen patch, progesterone, and NP thyroid. -No medication changes today

## 2023-02-12 NOTE — Progress Notes (Signed)
New Patient Office Visit  Subjective    Patient ID: Sabrina Clark, female    DOB: 19-Mar-1956  Age: 67 y.o. MRN: [id number]  CC:  Chief Complaint  Patient presents with   Establish Care    HPI Sabrina Clark presents to establish care.  She is a 67 year old female who endorses a past medical history significant for asthma, seasonal allergies, hypogonadism, and fibromyalgia.  She has most recently been followed by Dr. Neva Seat and [facility] also [facility].  Ms. Bellomo reports feeling well today.  She is asymptomatic and has no acute concerns to discuss aside from desiring to establish care.  She denies tobacco use and endorses occasional alcohol and marijuana use.  Her family medical history is significant for melanoma, breast cancer, arthritis, DM, non-Hodgkin's lymphoma, and Graves' disease.  Chronic medical conditions and outstanding preventative care items discussed today are individually addressed A/P below.   Outpatient Encounter Medications as of 02/12/2023  Medication Sig   albuterol (VENTOLIN HFA) 108 (90 Base) MCG/ACT inhaler Inhale 2 puffs into the lungs every 4 (four) hours as needed for wheezing or shortness of breath.   butalbital-acetaminophen-caffeine (FIORICET) 50-325-40 MG tablet Take 1-2 tablets by mouth every 6 (six) hours as needed for headache.   Cholecalciferol (VITAMIN D3) 250 MCG (10000 UT) TABS Take 1 capsule by mouth daily.   EPINEPHrine (EPIPEN 2-PAK) 0.3 mg/0.3 mL IJ SOAJ injection Inject 0.3 mg into the muscle as needed for anaphylaxis.   estradiol (ESTRACE) 2 MG tablet Take 1 tablet (2 mg total) by mouth daily.   fexofenadine (ALLEGRA) 180 MG tablet Take 1 tablet (180 mg total) by mouth 2 (two) times daily as needed for allergies or rhinitis (Can use an extra dose during flare ups.). Now taking daily per allergist.   Misc Natural Products (NF FORMULAS TESTOSTERONE) CAPS 1 capsule daily by mouth   NP THYROID 30 MG tablet Take 1 tablet  (30 mg total) by mouth every evening.   NP THYROID 60 MG tablet Take 1 tablet (60 mg total) by mouth daily before breakfast.   progesterone (PROMETRIUM) 100 MG capsule Take 3 capsules (300 mg total) by mouth at bedtime.   RYALTRIS [id number] MCG/ACT SUSP Place 2 sprays into the nose in the morning and at bedtime.   TRELEGY ELLIPTA 200-62.5-25 MCG/ACT AEPB Inhale 1 puff into the lungs daily.   No facility-administered encounter medications on file as of 02/12/2023.    Past Medical History:  Diagnosis Date   Asthma    Female hypogonadism syndrome    Fibromyalgia 07/01/2019   Malaise and fatigue    Vitamin D deficiency disease     Past Surgical History:  Procedure Laterality Date   BREAST BIOPSY Left 2019   x3   BREAST CYST ASPIRATION     CYSTECTOMY      Family History  Problem Relation Age of Onset   Graves' disease Mother    Fibromyalgia Mother    Allergic rhinitis Father    Cancer Father    Diabetes Father    Breast cancer Neg Hx    Angioedema Neg Hx    Asthma Neg Hx    Atopy Neg Hx    Eczema Neg Hx    Immunodeficiency Neg Hx    Urticaria Neg Hx     Social History   Socioeconomic History   Marital status: Married    Spouse name: Not on file   Number of children: Not on file  Years of education: Not on file   Highest education level: Not on file  Occupational History   Not on file  Tobacco Use   Smoking status: Never   Smokeless tobacco: Never  Vaping Use   Vaping Use: Never used  Substance and Sexual Activity   Alcohol use: Yes    Alcohol/week: 4.0 standard drinks of alcohol    Types: 2 Glasses of wine, 2 Shots of liquor per week    Comment: sometimes less   Drug use: No   Sexual activity: Yes  Other Topics Concern   Not on file  Social History Narrative   Married 11 years,third.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   Social Determinants of Health   Financial Resource Strain: Not on file  Food Insecurity: Not on file  Transportation Needs: Not on file   Physical Activity: Not on file  Stress: Not on file  Social Connections: Not on file  Intimate Partner Violence: Not on file   Review of Systems  Constitutional:  Negative for chills and fever.  HENT:  Negative for sore throat.   Respiratory:  Negative for cough and shortness of breath.   Cardiovascular:  Negative for chest pain, palpitations and leg swelling.  Gastrointestinal:  Negative for abdominal pain, blood in stool, constipation, diarrhea, nausea and vomiting.  Genitourinary:  Negative for dysuria and hematuria.  Musculoskeletal:  Negative for myalgias.  Skin:  Negative for itching and rash.  Neurological:  Negative for dizziness and headaches.  Psychiatric/Behavioral:  Negative for depression and suicidal ideas.    Objective    BP 128/70   Pulse 78   Ht 5' 1.5" (1.562 m)   Wt 129 lb 9.6 oz (58.8 kg)   SpO2 98%   BMI 24.09 kg/m   Physical Exam Vitals reviewed.  Constitutional:      General: She is not in acute distress.    Appearance: Normal appearance. She is not toxic-appearing.  HENT:     Head: Normocephalic and atraumatic.     Right Ear: External ear normal.     Left Ear: External ear normal.     Nose: Nose normal. No congestion or rhinorrhea.     Mouth/Throat:     Mouth: Mucous membranes are moist.     Pharynx: Oropharynx is clear. No oropharyngeal exudate or posterior oropharyngeal erythema.  Eyes:     General: No scleral icterus.    Extraocular Movements: Extraocular movements intact.     Conjunctiva/sclera: Conjunctivae normal.     Pupils: Pupils are equal, round, and reactive to light.  Cardiovascular:     Rate and Rhythm: Normal rate and regular rhythm.     Pulses: Normal pulses.     Heart sounds: Normal heart sounds. No murmur heard.    No friction rub. No gallop.  Pulmonary:     Effort: Pulmonary effort is normal.     Breath sounds: Normal breath sounds. No wheezing, rhonchi or rales.  Abdominal:     General: Abdomen is flat. Bowel sounds  are normal. There is no distension.     Palpations: Abdomen is soft.     Tenderness: There is no abdominal tenderness.  Musculoskeletal:        General: No swelling. Normal range of motion.     Cervical back: Normal range of motion.     Right lower leg: No edema.     Left lower leg: No edema.  Lymphadenopathy:     Cervical: No cervical adenopathy.  Skin:    General:  Skin is warm and dry.     Capillary Refill: Capillary refill takes less than 2 seconds.     Coloration: Skin is not jaundiced.  Neurological:     General: No focal deficit present.     Mental Status: She is alert and oriented to person, place, and time.  Psychiatric:        Mood and Affect: Mood normal.        Behavior: Behavior normal.    Assessment & Plan:   Problem List Items Addressed This Visit       Seasonal and perennial allergic rhinitis    Followed by allergy and immunology (Dr. Dellis Anes).  She is prescribed Allegra, Nasacort, and Ryaltris. -No medication changes today      Asthma - Primary    She is currently prescribed Trelegy and albuterol.  States that she needs to use her albuterol inhaler roughly 1-2 times per month.  Asymptomatic currently.  Unremarkable pulmonary exam today. -No medication changes today      Female hypogonadism syndrome    This is managed by Delrae Rend MD.  She is currently prescribed testosterone supplementation, estrogen patch, progesterone, and NP thyroid. -No medication changes today      Fibromyalgia    Previously documented history of fibromyalgia.  She is asymptomatic currently. -No medication changes today.      Encounter for general adult medical examination with abnormal findings    Presenting today to establish care.  Available records and labs have been reviewed. -She will bring recent labs from [facility] to her next appointment. -We will request records from her other providers. -Additional preventative care items are largely up-to-date.  I recommended that  she receive outstanding Shingrix vaccination at her pharmacy.  She will also consider PCV 20 vaccination at her next appointment. -We will tentatively plan for follow-up in 3 months.       Return in about 6 months (around 08/14/2023).   Billie Lade, MD

## 2023-02-12 NOTE — Assessment & Plan Note (Signed)
Followed by allergy and immunology (Dr. Dellis Anes).  She is prescribed Allegra, Nasacort, and Ryaltris. -No medication changes today

## 2023-02-19 DIAGNOSIS — N951 Menopausal and female climacteric states: Secondary | ICD-10-CM | POA: Diagnosis not present

## 2023-02-19 DIAGNOSIS — E039 Hypothyroidism, unspecified: Secondary | ICD-10-CM | POA: Diagnosis not present

## 2023-02-20 ENCOUNTER — Ambulatory Visit (INDEPENDENT_AMBULATORY_CARE_PROVIDER_SITE_OTHER): Payer: Medicare HMO

## 2023-02-20 DIAGNOSIS — J309 Allergic rhinitis, unspecified: Secondary | ICD-10-CM | POA: Diagnosis not present

## 2023-03-06 DIAGNOSIS — E039 Hypothyroidism, unspecified: Secondary | ICD-10-CM | POA: Diagnosis not present

## 2023-03-06 DIAGNOSIS — M2559 Pain in other specified joint: Secondary | ICD-10-CM | POA: Diagnosis not present

## 2023-03-06 DIAGNOSIS — Z6824 Body mass index (BMI) 24.0-24.9, adult: Secondary | ICD-10-CM | POA: Diagnosis not present

## 2023-03-06 DIAGNOSIS — R5383 Other fatigue: Secondary | ICD-10-CM | POA: Diagnosis not present

## 2023-03-06 DIAGNOSIS — N951 Menopausal and female climacteric states: Secondary | ICD-10-CM | POA: Diagnosis not present

## 2023-03-27 ENCOUNTER — Ambulatory Visit (INDEPENDENT_AMBULATORY_CARE_PROVIDER_SITE_OTHER): Payer: Medicare HMO

## 2023-03-27 DIAGNOSIS — J309 Allergic rhinitis, unspecified: Secondary | ICD-10-CM | POA: Diagnosis not present

## 2023-04-03 ENCOUNTER — Encounter: Payer: Self-pay | Admitting: Internal Medicine

## 2023-04-03 ENCOUNTER — Ambulatory Visit (INDEPENDENT_AMBULATORY_CARE_PROVIDER_SITE_OTHER): Payer: Medicare HMO | Admitting: Internal Medicine

## 2023-04-03 DIAGNOSIS — Z0000 Encounter for general adult medical examination without abnormal findings: Secondary | ICD-10-CM | POA: Diagnosis not present

## 2023-04-03 NOTE — Progress Notes (Signed)
Subjective:   Sabrina Clark is a 67 y.o. female who presents for Medicare Annual (Subsequent) preventive examination.  Visit Complete: Virtual  I connected with  Sabrina Clark on 04/03/23 by a audio enabled telemedicine application and verified that I am speaking with the correct person using two identifiers.  Patient Location: Home  Provider Location: Office/Clinic  I discussed the limitations of evaluation and management by telemedicine. The patient expressed understanding and agreed to proceed.  Patient Medicare AWV questionnaire was not completed by the patient.   Review of Systems    Review of Systems  All other systems reviewed and are negative.    Objective:    There were no vitals filed for this visit. There is no height or weight on file to calculate BMI.     04/03/2023   11:44 AM 05/03/2022    1:41 PM 10/09/2020    2:59 PM 01/15/2016    5:16 PM  Advanced Directives  Does Patient Have a Medical Advance Directive? Yes No No No  Does patient want to make changes to medical advance directive? Yes (MAU/Ambulatory/Procedural Areas - Information given)     Would patient like information on creating a medical advance directive?  Yes (ED - Information included in AVS)  No - patient declined information    Current Medications (verified) Outpatient Encounter Medications as of 04/03/2023  Medication Sig   estradiol (VIVELLE-DOT) 0.1 MG/24HR patch 1 patch 2 (two) times a week.   albuterol (VENTOLIN HFA) 108 (90 Base) MCG/ACT inhaler Inhale 2 puffs into the lungs every 4 (four) hours as needed for wheezing or shortness of breath.   butalbital-acetaminophen-caffeine (FIORICET) 50-325-40 MG tablet Take 1-2 tablets by mouth every 6 (six) hours as needed for headache.   Cholecalciferol (VITAMIN D3) 250 MCG (10000 UT) TABS Take 1 capsule by mouth daily.   EPINEPHrine (EPIPEN 2-PAK) 0.3 mg/0.3 mL IJ SOAJ injection Inject 0.3 mg into the muscle as needed for anaphylaxis.    fexofenadine (ALLEGRA) 180 MG tablet Take 1 tablet (180 mg total) by mouth 2 (two) times daily as needed for allergies or rhinitis (Can use an extra dose during flare ups.). Now taking daily per allergist.   Misc Natural Products (NF FORMULAS TESTOSTERONE) CAPS 1 capsule daily by mouth   NP THYROID 30 MG tablet Take 1 tablet (30 mg total) by mouth every evening.   NP THYROID 60 MG tablet Take 1 tablet (60 mg total) by mouth daily before breakfast.   progesterone (PROMETRIUM) 100 MG capsule Take 3 capsules (300 mg total) by mouth at bedtime.   RYALTRIS [id number] MCG/ACT SUSP Place 2 sprays into the nose in the morning and at bedtime.   TRELEGY ELLIPTA 200-62.5-25 MCG/ACT AEPB Inhale 1 puff into the lungs daily.   [DISCONTINUED] estradiol (ESTRACE) 2 MG tablet Take 1 tablet (2 mg total) by mouth daily.   No facility-administered encounter medications on file as of 04/03/2023.    Allergies (verified) Codeine, Cefdinir, and Diphenhydramine hcl   History: Past Medical History:  Diagnosis Date   Asthma    Female hypogonadism syndrome    Fibromyalgia 07/01/2019   Malaise and fatigue    Vitamin D deficiency disease    Past Surgical History:  Procedure Laterality Date   BREAST BIOPSY Left 2019   x3   BREAST CYST ASPIRATION     CYSTECTOMY     Family History  Problem Relation Age of Onset   Graves' disease Mother    Fibromyalgia Mother    Allergic  rhinitis Father    Cancer Father    Diabetes Father    Breast cancer Neg Hx    Angioedema Neg Hx    Asthma Neg Hx    Atopy Neg Hx    Eczema Neg Hx    Immunodeficiency Neg Hx    Urticaria Neg Hx    Social History   Socioeconomic History   Marital status: Married    Spouse name: Not on file   Number of children: Not on file   Years of education: Not on file   Highest education level: Not on file  Occupational History   Not on file  Tobacco Use   Smoking status: Never   Smokeless tobacco: Never  Vaping Use   Vaping Use: Never used   Substance and Sexual Activity   Alcohol use: Yes    Alcohol/week: 4.0 standard drinks of alcohol    Types: 2 Glasses of wine, 2 Shots of liquor per week    Comment: sometimes less   Drug use: No   Sexual activity: Yes    Partners: Male  Other Topics Concern   Not on file  Social History Narrative   Married 11 years,third.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   Social Determinants of Health   Financial Resource Strain: Not on file  Food Insecurity: Not on file  Transportation Needs: Not on file  Physical Activity: Sufficiently Active (04/03/2023)   Exercise Vital Sign    Days of Exercise per Week: 7 days    Minutes of Exercise per Session: 30 min  Stress: Not on file  Social Connections: Not on file    Tobacco Counseling Counseling given: Not Answered   Clinical Intake:  Pre-visit preparation completed: Yes  Pain : No/denies pain     Diabetes: No  How often do you need to have someone help you when you read instructions, pamphlets, or other written materials from your doctor or pharmacy?: 1 - Never What is the last grade level you completed in school?: college  Interpreter Needed?: No      Activities of Daily Living    04/03/2023   11:35 AM  In your present state of health, do you have any difficulty performing the following activities:  Hearing? 0  Vision? 0  Difficulty concentrating or making decisions? 0  Walking or climbing stairs? 0  Dressing or bathing? 0  Doing errands, shopping? 0    Patient Care Team: Billie Lade, MD as PCP - General (Internal Medicine)  Indicate any recent Medical Services you may have received from other than [facility] providers in the past year (date may be approximate).     Assessment:   This is a routine wellness examination for [facility].  Hearing/Vision screen No results found.  Dietary issues and exercise activities discussed:     Goals Addressed   None    Depression Screen    04/03/2023   11:45 AM 05/26/2021    11:08 AM 09/01/2020    9:40 AM 02/08/2020   10:41 AM  PHQ 2/9 Scores  PHQ - 2 Score 0 0 0 0  PHQ- 9 Score  0 0   Exception Documentation    Medical reason    Fall Risk    04/03/2023   11:44 AM 05/26/2021   11:08 AM 02/08/2020   10:41 AM  Fall Risk   Falls in the past year? 0 0 0  Number falls in past yr:  0 0  Injury with Fall?  0 0  Risk for  fall due to :  No Fall Risks No Fall Risks  Follow up  Falls evaluation completed Falls evaluation completed    MEDICARE RISK AT HOME:  Medicare Risk at Home - 04/03/23 1144     Any stairs in or around the home? Yes    If so, are there any without handrails? No    Home free of loose throw rugs in walkways, pet beds, electrical cords, etc? Yes    Adequate lighting in your home to reduce risk of falls? Yes    Life alert? No    Use of a cane, walker or w/c? No    Grab bars in the bathroom? Yes    Shower chair or bench in shower? No    Elevated toilet seat or a handicapped toilet? Yes             TIMED UP AND GO:  Was the test performed?  No    Cognitive Function:        04/03/2023   11:45 AM  6CIT Screen  What Year? 0 points  What month? 0 points  What time? 0 points  Count back from 20 0 points  Months in reverse 0 points  Repeat phrase 0 points  Total Score 0 points    Immunizations Immunization History  Administered Date(s) Administered   PFIZER(Purple Top)SARS-COV-2 Vaccination 12/17/2019, 01/13/2020    TDAP status: Due, Education has been provided regarding the importance of this vaccine. Advised may receive this vaccine at local pharmacy or Health Dept. Aware to provide a copy of the vaccination record if obtained from local pharmacy or Health Dept. Verbalized acceptance and understanding.  Flu Vaccine status: Up to date  Pneumococcal vaccine status: Due, Education has been provided regarding the importance of this vaccine. Advised may receive this vaccine at local pharmacy or Health Dept. Aware to provide a  copy of the vaccination record if obtained from local pharmacy or Health Dept. Verbalized acceptance and understanding.  Covid-19 vaccine status: Information provided on how to obtain vaccines.   Qualifies for Shingles Vaccine? Yes   Zostavax completed No   Shingrix Completed?: No.    Education has been provided regarding the importance of this vaccine. Patient has been advised to call insurance company to determine out of pocket expense if they have not yet received this vaccine. Advised may also receive vaccine at local pharmacy or Health Dept. Verbalized acceptance and understanding.  Screening Tests Health Maintenance  Topic Date Due   Medicare Annual Wellness (AWV)  Never done   DTaP/Tdap/Td (1 - Tdap) Never done   Zoster Vaccines- Shingrix (1 of 2) Never done   COVID-19 Vaccine (4 - 2023-24 season) 06/15/2022   Colonoscopy  09/01/2022   Pneumonia Vaccine 9+ Years old (1 of 1 - PCV) Never done   DEXA SCAN  Never done   PAP SMEAR-Modifier  10/21/2022   INFLUENZA VACCINE  05/16/2023   MAMMOGRAM  01/05/2024   Hepatitis C Screening  Completed   HIV Screening  Completed   HPV VACCINES  Aged Out    Health Maintenance  Health Maintenance Due  Topic Date Due   Medicare Annual Wellness (AWV)  Never done   DTaP/Tdap/Td (1 - Tdap) Never done   Zoster Vaccines- Shingrix (1 of 2) Never done   COVID-19 Vaccine (4 - 2023-24 season) 06/15/2022   Colonoscopy  09/01/2022   Pneumonia Vaccine 75+ Years old (1 of 1 - PCV) Never done   DEXA SCAN  Never done  PAP SMEAR-Modifier  10/21/2022    Colorectal cancer screening: Type of screening: Colonoscopy. Completed 09/01/2012. Repeat every 10 yearsPatient aware, she had horrible experience with last colonoscopy with dehydration. We do not have Cologuard record , complete 2 years ago?  Mammogram status: Completed 01/04/2022. Repeat every year  Bone Density status: Completed at [facility], need records.  Lung Cancer Screening: (Low Dose CT  Chest recommended if Age 49-80 years, 20 pack-year currently smoking OR have quit w/in 15years.) does not qualify.     Additional Screening:  Hepatitis C Screening: does qualify; Completed 07/01/2019  Vision Screening: Recommended annual ophthalmology exams for early detection of glaucoma and other disorders of the eye. Is the patient up to date with their annual eye exam?  Yes  Who is the provider or what is the name of the office in which the patient attends annual eye exams? [facility] If pt is not established with a provider, would they like to be referred to a provider to establish care? No .   Dental Screening: Recommended annual dental exams for proper oral hygiene   Community Resource Referral / Chronic Care Management: CRR required this visit?  No   CCM required this visit?  No     Plan:     I have personally reviewed and noted the following in the patient's chart:   Medical and social history Use of alcohol, tobacco or illicit drugs  Current medications and supplements including opioid prescriptions. Patient is not currently taking opioid prescriptions. Functional ability and status Nutritional status Physical activity Advanced directives List of other physicians Hospitalizations, surgeries, and ER visits in previous 12 months Vitals Screenings to include cognitive, depression, and falls Referrals and appointments  In addition, I have reviewed and discussed with patient certain preventive protocols, quality metrics, and best practice recommendations. A written personalized care plan for preventive services as well as general preventive health recommendations were provided to patient.     Milus Banister, MD   04/03/2023   After Visit Summary: (MyChart) Due to this being a telephonic visit, the after visit summary with patients personalized plan was offered to patient via MyChart

## 2023-04-03 NOTE — Patient Instructions (Signed)
  Ms. Baymon , Thank you for taking time to come for your Medicare Wellness Visit. I appreciate your ongoing commitment to your health goals. Please review the following plan we discussed and let me know if I can assist you in the future.   These are the goals we discussed:  Goals   None     This is a list of the screening recommended for you and due dates:  Health Maintenance  Topic Date Due   Medicare Annual Wellness Visit  Never done   DTaP/Tdap/Td vaccine (1 - Tdap) Never done   Zoster (Shingles) Vaccine (1 of 2) Never done   COVID-19 Vaccine (4 - 2023-24 season) 06/15/2022   Colon Cancer Screening  09/01/2022   Pneumonia Vaccine (1 of 1 - PCV) Never done   DEXA scan (bone density measurement)  Never done   Pap Smear  10/21/2022   Flu Shot  05/16/2023   Mammogram  01/05/2024   Hepatitis C Screening  Completed   HIV Screening  Completed   HPV Vaccine  Aged Out

## 2023-04-24 ENCOUNTER — Encounter: Payer: Self-pay | Admitting: Obstetrics and Gynecology

## 2023-04-24 ENCOUNTER — Other Ambulatory Visit: Payer: Self-pay | Admitting: Obstetrics and Gynecology

## 2023-04-24 DIAGNOSIS — Z1231 Encounter for screening mammogram for malignant neoplasm of breast: Secondary | ICD-10-CM

## 2023-04-26 ENCOUNTER — Ambulatory Visit
Admission: RE | Admit: 2023-04-26 | Discharge: 2023-04-26 | Disposition: A | Discharge status: Home / Self Care | Payer: Medicare HMO | Source: Ambulatory Visit | Attending: Obstetrics and Gynecology | Admitting: Obstetrics and Gynecology

## 2023-04-26 DIAGNOSIS — Z1231 Encounter for screening mammogram for malignant neoplasm of breast: Secondary | ICD-10-CM

## 2023-05-01 ENCOUNTER — Ambulatory Visit (INDEPENDENT_AMBULATORY_CARE_PROVIDER_SITE_OTHER): Payer: Medicare HMO

## 2023-05-01 DIAGNOSIS — J309 Allergic rhinitis, unspecified: Secondary | ICD-10-CM

## 2023-05-06 ENCOUNTER — Other Ambulatory Visit: Payer: Self-pay | Admitting: Obstetrics and Gynecology

## 2023-05-06 DIAGNOSIS — N6310 Unspecified lump in the right breast, unspecified quadrant: Secondary | ICD-10-CM

## 2023-05-15 ENCOUNTER — Other Ambulatory Visit: Payer: Medicare HMO

## 2023-05-20 ENCOUNTER — Ambulatory Visit
Admission: RE | Admit: 2023-05-20 | Discharge: 2023-05-20 | Disposition: A | Discharge status: Home / Self Care | Payer: Medicare HMO | Source: Ambulatory Visit | Attending: Obstetrics and Gynecology | Admitting: Obstetrics and Gynecology

## 2023-05-20 DIAGNOSIS — N6310 Unspecified lump in the right breast, unspecified quadrant: Secondary | ICD-10-CM

## 2023-05-27 DIAGNOSIS — N6001 Solitary cyst of right breast: Secondary | ICD-10-CM | POA: Insufficient documentation

## 2023-06-04 ENCOUNTER — Telehealth (INDEPENDENT_AMBULATORY_CARE_PROVIDER_SITE_OTHER): Payer: Medicare HMO | Admitting: Internal Medicine

## 2023-06-04 ENCOUNTER — Encounter: Payer: Self-pay | Admitting: Internal Medicine

## 2023-06-04 VITALS — Temp 100.2°F

## 2023-06-04 DIAGNOSIS — B9789 Other viral agents as the cause of diseases classified elsewhere: Secondary | ICD-10-CM | POA: Insufficient documentation

## 2023-06-04 DIAGNOSIS — J988 Other specified respiratory disorders: Secondary | ICD-10-CM | POA: Diagnosis not present

## 2023-06-04 MED ORDER — PREDNISONE 20 MG PO TABS: 20.0000 mg | ORAL_TABLET | Freq: Every day | ORAL | 0 refills | Status: AC

## 2023-06-04 MED ORDER — ALBUTEROL SULFATE HFA 108 (90 BASE) MCG/ACT IN AERS: 2.0000 | INHALATION_SPRAY | RESPIRATORY_TRACT | 1 refills | Status: DC | PRN

## 2023-06-04 NOTE — Progress Notes (Signed)
Virtual Visit via Video Note  I connected with Sabrina Clark on 06/04/23 at  3:20 PM EDT by a video enabled telemedicine application and verified that I am speaking with the correct person using two identifiers.  Patient Location: Home Provider Location: Office/Clinic  I discussed the limitations, risks, security, and privacy concerns of performing an evaluation and management service by video and the availability of in person appointments. I also discussed with the patient that there may be a patient responsible charge related to this service. The patient expressed understanding and agreed to proceed.  Subjective: PCP: Billie Lade, MD  Chief Complaint  Patient presents with   Cough    Cough, headaches, chest tightness, fatigue and joint pain that started last night and was worse today. Been using inhaler. Fever been staying around 98.2 but has went up to 100.2. will take at home covid test.   Sabrina Clark has been evaluated for an acute visit through video encounter today.  She endorses a 1 day history of dry cough, sore throat, fatigue, diffuse arthralgias, chills, headache, and nausea.  Symptoms began last night and today she feels worse.  She is using her albuterol inhaler more frequently but has not tried taking additional medications for symptom relief.  She denies vomiting and diarrhea and states that her cough is nonproductive.  She is unaware of any recent sick contacts.  She has not completed a home COVID-19 test currently, but states that her husband has gone to purchase one.  She received her initial COVID-19 vaccinations but has not received any booster vaccines.  ROS: Per HPI  Current Outpatient Medications:    butalbital-acetaminophen-caffeine (FIORICET) 50-325-40 MG tablet, Take 1-2 tablets by mouth every 6 (six) hours as needed for headache., Disp: 20 tablet, Rfl: 5   Cholecalciferol (VITAMIN D3) 250 MCG (10000 UT) TABS, Take 1 capsule by mouth daily., Disp: , Rfl:     EPINEPHrine (EPIPEN 2-PAK) 0.3 mg/0.3 mL IJ SOAJ injection, Inject 0.3 mg into the muscle as needed for anaphylaxis., Disp: 2 each, Rfl: 1   estradiol (VIVELLE-DOT) 0.1 MG/24HR patch, 1 patch 2 (two) times a week., Disp: , Rfl:    fexofenadine (ALLEGRA) 180 MG tablet, Take 1 tablet (180 mg total) by mouth 2 (two) times daily as needed for allergies or rhinitis (Can use an extra dose during flare ups.). Now taking daily per allergist., Disp: 60 tablet, Rfl: 5   Misc Natural Products (NF FORMULAS TESTOSTERONE) CAPS, 1 capsule daily by mouth, Disp: 90 capsule, Rfl: 1   NP THYROID 30 MG tablet, Take 1 tablet (30 mg total) by mouth every evening., Disp: 30 tablet, Rfl: 1   NP THYROID 60 MG tablet, Take 1 tablet (60 mg total) by mouth daily before breakfast., Disp: 30 tablet, Rfl: 1   predniSONE (DELTASONE) 20 MG tablet, Take 1 tablet (20 mg total) by mouth daily with breakfast for 5 days., Disp: 5 tablet, Rfl: 0   progesterone (PROMETRIUM) 100 MG capsule, Take 3 capsules (300 mg total) by mouth at bedtime., Disp: 90 capsule, Rfl: 3   RYALTRIS 665-25 MCG/ACT SUSP, Place 2 sprays into the nose in the morning and at bedtime., Disp: 29 g, Rfl: 5   TRELEGY ELLIPTA 200-62.5-25 MCG/ACT AEPB, Inhale 1 puff into the lungs daily., Disp: 28 each, Rfl: 5   albuterol (VENTOLIN HFA) 108 (90 Base) MCG/ACT inhaler, Inhale 2 puffs into the lungs every 4 (four) hours as needed for wheezing or shortness of breath., Disp: 18 g, Rfl:  1  Observations/Objective: Today's Vitals   06/04/23 1413  Temp: 100.2 F (37.9 C)   Assessment and Plan:  Viral respiratory illness Assessment & Plan: Evaluated through video encounter today endorsing the symptoms described above.  She is using her albuterol inhaler more frequently.  Altogether, her symptoms are concerning for viral respiratory illness, specifically COVID-19.  Home COVID-19 test is pending. -Prednisone 20 mg x 5 days prescribed today due to increased albuterol inhaler  use in the setting of asthma -Reviewed additional supportive care measures, including regular use of Tylenol and adequate hydration. -She will complete a home COVID-19 test this afternoon.  If positive, I will prescribe Paxlovid. -She was instructed to return to care if her symptoms worsen or fail to improve by the end of the week.  Follow Up Instructions: Return if symptoms worsen or fail to improve.   I discussed the assessment and treatment plan with the patient. The patient was provided an opportunity to ask questions, and all were answered. The patient agreed with the plan and demonstrated an understanding of the instructions.   The patient was advised to call back or seek an in-person evaluation if the symptoms worsen or if the condition fails to improve as anticipated.  The above assessment and management plan was discussed with the patient. The patient verbalized understanding of and has agreed to the management plan.   Billie Lade, MD

## 2023-06-04 NOTE — Assessment & Plan Note (Signed)
Evaluated through video encounter today endorsing the symptoms described above.  She is using her albuterol inhaler more frequently.  Altogether, her symptoms are concerning for viral respiratory illness, specifically COVID-19.  Home COVID-19 test is pending. -Prednisone 20 mg x 5 days prescribed today due to increased albuterol inhaler use in the setting of asthma -Reviewed additional supportive care measures, including regular use of Tylenol and adequate hydration. -She will complete a home COVID-19 test this afternoon.  If positive, I will prescribe Paxlovid. -She was instructed to return to care if her symptoms worsen or fail to improve by the end of the week.

## 2023-06-05 ENCOUNTER — Encounter: Payer: Self-pay | Admitting: Internal Medicine

## 2023-06-05 DIAGNOSIS — U071 COVID-19: Secondary | ICD-10-CM

## 2023-06-06 MED ORDER — NIRMATRELVIR/RITONAVIR (PAXLOVID)TABLET
3.0000 | ORAL_TABLET | Freq: Two times a day (BID) | ORAL | 0 refills | Status: AC
Start: 2023-06-06 — End: 2023-06-11

## 2023-06-19 DIAGNOSIS — E039 Hypothyroidism, unspecified: Secondary | ICD-10-CM | POA: Diagnosis not present

## 2023-06-19 DIAGNOSIS — N951 Menopausal and female climacteric states: Secondary | ICD-10-CM | POA: Diagnosis not present

## 2023-06-21 ENCOUNTER — Telehealth: Payer: Self-pay | Admitting: Allergy & Immunology

## 2023-06-21 MED ORDER — TRELEGY ELLIPTA 200-62.5-25 MCG/ACT IN AEPB: 1.0000 | INHALATION_SPRAY | Freq: Every day | RESPIRATORY_TRACT | 5 refills | Status: DC

## 2023-06-21 NOTE — Addendum Note (Signed)
Addended by: Berna Bue on: 06/21/2023 10:07 AM   Modules accepted: Orders

## 2023-06-21 NOTE — Telephone Encounter (Signed)
Lm for pt to call us back it looks like trelegy is what she was to be on during sickness wondering if this is what she is referring too or not waiting on her call back

## 2023-06-21 NOTE — Telephone Encounter (Signed)
I am so glad that she is doing well! Thank you for the update!

## 2023-06-21 NOTE — Telephone Encounter (Signed)
Patient called stating that she recently had Covid and was prescribed an inhaler. Patient states she is now completely out of the inhaler and is wondering of she should get a refill of the inhaler or continue to use her albuterol inhaler.

## 2023-06-21 NOTE — Telephone Encounter (Signed)
Confirmed it was the trelegy and sent it into to [facility]  Dr Dellis Anes she wanted to let you know she had Covid 2 weeks ago did ryaltris and trelegy and it really did help her thru it.

## 2023-06-25 ENCOUNTER — Ambulatory Visit (INDEPENDENT_AMBULATORY_CARE_PROVIDER_SITE_OTHER): Payer: Medicare HMO | Admitting: Family Medicine

## 2023-06-25 ENCOUNTER — Ambulatory Visit: Payer: Medicare HMO

## 2023-06-25 ENCOUNTER — Encounter: Payer: Self-pay | Admitting: Family Medicine

## 2023-06-25 ENCOUNTER — Telehealth: Payer: Self-pay | Admitting: Internal Medicine

## 2023-06-25 ENCOUNTER — Ambulatory Visit (HOSPITAL_COMMUNITY)
Admission: RE | Admit: 2023-06-25 | Discharge: 2023-06-25 | Disposition: A | Discharge status: Home / Self Care | Payer: Medicare HMO | Source: Ambulatory Visit | Attending: Family Medicine | Admitting: Family Medicine

## 2023-06-25 VITALS — BP 128/86 | HR 103 | Wt 124.1 lb

## 2023-06-25 DIAGNOSIS — R5383 Other fatigue: Secondary | ICD-10-CM | POA: Diagnosis not present

## 2023-06-25 DIAGNOSIS — Z6823 Body mass index (BMI) 23.0-23.9, adult: Secondary | ICD-10-CM | POA: Diagnosis not present

## 2023-06-25 DIAGNOSIS — N951 Menopausal and female climacteric states: Secondary | ICD-10-CM | POA: Diagnosis not present

## 2023-06-25 NOTE — Telephone Encounter (Signed)
Patient made an appt for 9/10 with Malachi Bonds and appt on 9/12 with Patel.

## 2023-06-25 NOTE — Telephone Encounter (Signed)
Patient called in regard to last visit on 8/20  Patient is now feeling worse. Had heart palpitations and dizziness.  Wants a call back in regard ,wants to know what she needs to do .   Pt did schedule appointment on Thursday, but wants to speak with office to see if appointment is necessary.

## 2023-06-25 NOTE — Assessment & Plan Note (Signed)
The patient's symptoms are likely related to long COVID. Today, we will assess CBC, BMP, vitamin D, and thyroid levels. A chest X-ray will also be performed to rule out cardiovascular abnormalities and pneumonia, as requested by the patient. Recommendations include pacing and energy management, gradually increasing activity, ensuring proper sleep hygiene, maintaining a balanced diet, and staying well-hydrated.

## 2023-06-25 NOTE — Progress Notes (Signed)
Acute Office Visit  Subjective:    Patient ID: Sabrina Clark, female    DOB: 11-16-1955, 67 y.o.   MRN: [id number]  Chief Complaint  Patient presents with   Fatigue    Post covid sx , had covid 3 weeks ago, reports feeling fatigue, headache has dizziness just doesn't feel like her self.     HPI The patient presents today with complaints of fatigue, headaches, and slight dizziness. She reports testing positive for COVID-19 three weeks ago and was treated with Paxlovid, completing the full course of the treatment regimen. However, she notes increased fatigue over the past two days. The patient has been using her albuterol inhaler as needed. She denies any headaches, fever, or chills today. The patient expresses concern about the possibility of pneumonia but denies any chest pain, palpitations, lower extremity swelling, or orthopnea.   Past Medical History:  Diagnosis Date   Asthma    Female hypogonadism syndrome    Fibromyalgia 07/01/2019   Malaise and fatigue    Vitamin D deficiency disease     Past Surgical History:  Procedure Laterality Date   BREAST BIOPSY Left 2019   x3   BREAST CYST ASPIRATION     CYSTECTOMY      Family History  Problem Relation Age of Onset   Graves' disease Mother    Fibromyalgia Mother    Allergic rhinitis Father    Cancer Father    Diabetes Father    Breast cancer Neg Hx    Angioedema Neg Hx    Asthma Neg Hx    Atopy Neg Hx    Eczema Neg Hx    Immunodeficiency Neg Hx    Urticaria Neg Hx     Social History   Socioeconomic History   Marital status: Married    Spouse name: Not on file   Number of children: Not on file   Years of education: Not on file   Highest education level: Not on file  Occupational History   Not on file  Tobacco Use   Smoking status: Never   Smokeless tobacco: Never  Vaping Use   Vaping status: Never Used  Substance and Sexual Activity   Alcohol use: Yes    Alcohol/week: 4.0 standard drinks of alcohol     Types: 2 Glasses of wine, 2 Shots of liquor per week    Comment: sometimes less   Drug use: No   Sexual activity: Yes    Partners: Male  Other Topics Concern   Not on file  Social History Narrative   Married 11 years,third.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   Social Determinants of Health   Financial Resource Strain: Not on file  Food Insecurity: Not on file  Transportation Needs: Not on file  Physical Activity: Sufficiently Active (04/03/2023)   Exercise Vital Sign    Days of Exercise per Week: 7 days    Minutes of Exercise per Session: 30 min  Stress: Not on file  Social Connections: Not on file  Intimate Partner Violence: Not on file    Outpatient Medications Prior to Visit  Medication Sig Dispense Refill   albuterol (VENTOLIN HFA) 108 (90 Base) MCG/ACT inhaler Inhale 2 puffs into the lungs every 4 (four) hours as needed for wheezing or shortness of breath. 18 g 1   butalbital-acetaminophen-caffeine (FIORICET) 50-325-40 MG tablet Take 1-2 tablets by mouth every 6 (six) hours as needed for headache. 20 tablet 5   Cholecalciferol (VITAMIN D3) 250 MCG (10000 UT) TABS Take 1  capsule by mouth daily.     EPINEPHrine (EPIPEN 2-PAK) 0.3 mg/0.3 mL IJ SOAJ injection Inject 0.3 mg into the muscle as needed for anaphylaxis. 2 each 1   estradiol (VIVELLE-DOT) 0.1 MG/24HR patch 1 patch 2 (two) times a week.     fexofenadine (ALLEGRA) 180 MG tablet Take 1 tablet (180 mg total) by mouth 2 (two) times daily as needed for allergies or rhinitis (Can use an extra dose during flare ups.). Now taking daily per allergist. 60 tablet 5   Misc Natural Products (NF FORMULAS TESTOSTERONE) CAPS 1 capsule daily by mouth 90 capsule 1   NP THYROID 60 MG tablet Take 1 tablet (60 mg total) by mouth daily before breakfast. 30 tablet 1   progesterone (PROMETRIUM) 100 MG capsule Take 3 capsules (300 mg total) by mouth at bedtime. 90 capsule 3   RYALTRIS 665-25 MCG/ACT SUSP Place 2 sprays into the nose in the morning  and at bedtime. 29 g 5   TRELEGY ELLIPTA 200-62.5-25 MCG/ACT AEPB Inhale 1 puff into the lungs daily. 28 each 5   NP THYROID 30 MG tablet Take 1 tablet (30 mg total) by mouth every evening. 30 tablet 1   No facility-administered medications prior to visit.    Allergies  Allergen Reactions   Codeine Hives    Itch     Cefdinir    Diphenhydramine Hcl Hives    Review of Systems  Constitutional:  Positive for fatigue. Negative for chills and fever.  Eyes:  Negative for visual disturbance.  Respiratory:  Negative for chest tightness and shortness of breath.   Neurological:  Negative for dizziness and headaches.       Objective:    Physical Exam HENT:     Head: Normocephalic.     Mouth/Throat:     Mouth: Mucous membranes are moist.  Cardiovascular:     Rate and Rhythm: Normal rate.     Heart sounds: Normal heart sounds.  Pulmonary:     Effort: Pulmonary effort is normal.     Breath sounds: Normal breath sounds.  Neurological:     Mental Status: She is alert.     BP 128/86 (BP Location: Left Arm)   Pulse (!) 103   Wt 124 lb 1.9 oz (56.3 kg)   SpO2 96%   BMI 23.07 kg/m  Wt Readings from Last 3 Encounters:  06/25/23 124 lb 1.9 oz (56.3 kg)  02/12/23 129 lb 9.6 oz (58.8 kg)  10/17/22 125 lb (56.7 kg)       Assessment & Plan:  Other fatigue Assessment & Plan: The patient's symptoms are likely related to long COVID. Today, we will assess CBC, BMP, vitamin D, and thyroid levels. A chest X-ray will also be performed to rule out cardiovascular abnormalities and pneumonia, as requested by the patient. Recommendations include pacing and energy management, gradually increasing activity, ensuring proper sleep hygiene, maintaining a balanced diet, and staying well-hydrated.  Orders: -     BMP8+EGFR -     CBC with Differential/Platelet -     TSH + free T4 -     VITAMIN D 25 Hydroxy (Vit-D Deficiency, Fractures) -     DG Chest 2 View  Note: This chart has been completed  using Engineer, civil (consulting) software, and while attempts have been made to ensure accuracy, certain words and phrases may not be transcribed as intended.    Gilmore Laroche, FNP

## 2023-06-25 NOTE — Patient Instructions (Addendum)
I appreciate the opportunity to provide care to you today!    Follow up:  pcp  Labs: please stop by the lab today to get your blood drawn (CBC,BMP Vit D, TSH)  Fatigue Management Recommendations -Pacing and Energy Management: Implement a balanced approach to activity and rest by utilizing pacing techniques. This involves breaking tasks into smaller, more manageable segments and scheduling regular breaks to prevent overexertion. -Gradual Increase in Physical Activity: Incorporate low-impact exercises, such as walking, stretching, or gentle yoga, into your routine. Initiate these activities at a slow pace and progressively increase intensity based on your tolerance. It is crucial to monitor your body's response and avoid excessive strain. -Sleep Hygiene: Develop a consistent sleep routine by adhering to regular sleep and wake times each day. Ensure your sleeping environment is conducive to rest by maintaining a cool, dark, and quiet atmosphere. Refrain from using electronic devices and consuming stimulants before bedtime. -Healthy Diet: Maintain a balanced diet that includes a variety of fruits, vegetables, lean proteins, and whole grains. Adequate hydration is also essential. Minimize intake of caffeine and sugary foods to prevent energy fluctuations and crashes.   Please continue to a heart-healthy diet and increase your physical activities. Try to exercise for at least five days a week.    It was a pleasure to see you and I look forward to continuing to work together on your health and well-being. Please do not hesitate to call the office if you need care or have questions about your care.  In case of emergency, please visit the Emergency Department for urgent care, or contact our clinic at [phone] to schedule an appointment. We're here to help you!   Have a wonderful day and week. With Gratitude, Gilmore Laroche MSN, FNP-BC

## 2023-06-26 LAB — CBC WITH DIFFERENTIAL/PLATELET
Basophils Absolute: 0.1 x10E3/uL (ref 0.0–0.2)
Basos: 1 %
EOS (ABSOLUTE): 0.2 x10E3/uL (ref 0.0–0.4)
Eos: 3 %
Hematocrit: 39.1 % (ref 34.0–46.6)
Hemoglobin: 12.7 g/dL (ref 11.1–15.9)
Immature Grans (Abs): 0 x10E3/uL (ref 0.0–0.1)
Immature Granulocytes: 0 %
Lymphocytes Absolute: 2 x10E3/uL (ref 0.7–3.1)
Lymphs: 26 %
MCH: 30 pg (ref 26.6–33.0)
MCHC: 32.5 g/dL (ref 31.5–35.7)
MCV: 92 fL (ref 79–97)
Monocytes Absolute: 0.9 x10E3/uL (ref 0.1–0.9)
Monocytes: 11 %
Neutrophils Absolute: 4.7 x10E3/uL (ref 1.4–7.0)
Neutrophils: 59 %
Platelets: 299 x10E3/uL (ref 150–450)
RBC: 4.24 x10E6/uL (ref 3.77–5.28)
RDW: 12.5 % (ref 11.7–15.4)
WBC: 8 x10E3/uL (ref 3.4–10.8)

## 2023-06-26 LAB — BMP8+EGFR
BUN/Creatinine Ratio: 10 — ABNORMAL LOW (ref 12–28)
BUN: 8 mg/dL (ref 8–27)
CO2: 26 mmol/L (ref 20–29)
Calcium: 9.2 mg/dL (ref 8.7–10.3)
Chloride: 104 mmol/L (ref 96–106)
Creatinine, Ser: 0.77 mg/dL (ref 0.57–1.00)
Glucose: 102 mg/dL — ABNORMAL HIGH (ref 70–99)
Potassium: 4.8 mmol/L (ref 3.5–5.2)
Sodium: 139 mmol/L (ref 134–144)
eGFR: 86 mL/min/{1.73_m2} (ref >59)

## 2023-06-26 LAB — TSH+FREE T4
Free T4: 1.08 ng/dL (ref 0.82–1.77)
TSH: 0.09 u[IU]/mL — ABNORMAL LOW (ref 0.450–4.500)

## 2023-06-27 ENCOUNTER — Other Ambulatory Visit: Payer: Self-pay | Admitting: Family Medicine

## 2023-06-27 ENCOUNTER — Ambulatory Visit: Payer: Medicare HMO | Admitting: Internal Medicine

## 2023-06-27 DIAGNOSIS — E0590 Thyrotoxicosis, unspecified without thyrotoxic crisis or storm: Secondary | ICD-10-CM

## 2023-07-04 ENCOUNTER — Other Ambulatory Visit: Payer: Self-pay

## 2023-07-04 ENCOUNTER — Encounter: Payer: Self-pay | Admitting: Allergy & Immunology

## 2023-07-04 ENCOUNTER — Ambulatory Visit: Payer: Medicare HMO | Admitting: Allergy & Immunology

## 2023-07-04 VITALS — BP 130/70 | HR 75 | Temp 98.4°F | Ht 60.0 in | Wt 129.2 lb

## 2023-07-04 DIAGNOSIS — J4520 Mild intermittent asthma, uncomplicated: Secondary | ICD-10-CM

## 2023-07-04 DIAGNOSIS — J302 Other seasonal allergic rhinitis: Secondary | ICD-10-CM

## 2023-07-04 DIAGNOSIS — K219 Gastro-esophageal reflux disease without esophagitis: Secondary | ICD-10-CM

## 2023-07-04 DIAGNOSIS — J3089 Other allergic rhinitis: Secondary | ICD-10-CM

## 2023-07-04 DIAGNOSIS — U099 Post covid-19 condition, unspecified: Secondary | ICD-10-CM | POA: Diagnosis not present

## 2023-07-04 MED ORDER — BUDESONIDE 0.5 MG/2ML IN SUSP: RESPIRATORY_TRACT | 2 refills | Status: DC

## 2023-07-04 MED ORDER — ALBUTEROL SULFATE (2.5 MG/3ML) 0.083% IN NEBU: 2.5000 mg | INHALATION_SOLUTION | Freq: Four times a day (QID) | RESPIRATORY_TRACT | 12 refills | Status: DC | PRN

## 2023-07-04 NOTE — Progress Notes (Signed)
FOLLOW UP  Date of Service/Encounter:  07/04/23   Assessment:   Moderate persistent asthma, uncomplicated - with worsening symptoms in the last couple of weeks due to a concurrent COVID19 infection   Seasonal and perennial allergic rhinitis (ragweed, weeds, grasses, indoor molds, dust mites, cat and dog) - on allergen immunotherapy with dose frozen at 0.15 mL  Long COVID  Plan/Recommendations:   1. Mild intermittent asthma, uncomplicated - Lung testing looked really good today. - Let's restart the Trelegy until the next visit. - Let's add on albuterol mixed with budesonide three times daily via the nebulizer to see if this helps at all. - We are going to do this for two weeks and we can see you in [place] then.  - I am going do some research on long COVID and see what the time table and definitions look like. - Daily controller medication(s): Trelegy one puff once daily - Prior to physical activity: albuterol 2 puffs 10-15 minutes before physical activity. - Rescue medications: albuterol 4 puffs every 4-6 hours as needed - Asthma control goals:  * Full participation in all desired activities (may need albuterol before activity) * Albuterol use two time or less a week on average (not counting use with activity) * Cough interfering with sleep two time or less a month * Oral steroids no more than once a year * No hospitalizations  2.Seasonal and perennial allergic rhinitis (ragweed, weeds, grasses, indoor molds, dust mites, cat and dog) - Let's talk about restarting allergy shots at the next visit in two weeks.  - Continue with: Allegra (fexofenadine) 180mg  table once daily and Nasacort (triamcinolone) one spray per nostril daily. - May take an extra dose of Allegra on the days your allergy symptoms are worse. - Continue with: on Ryaltris one spray per nostril up to twice daily   3. Return in about 2 weeks (around 07/18/2023).    Subjective:   Sabrina Clark is a 67 y.o.  female presenting today for follow up of  Chief Complaint  Patient presents with   Follow-up    Post COVID breathing issues    Sabrina Clark has a history of the following: Patient Active Problem List   Diagnosis Date Noted   Fatigue 06/25/2023   Viral respiratory illness 06/04/2023   Encounter for general adult medical examination with abnormal findings 02/12/2023   Anosmia 09/25/2021   Ageusia 09/25/2021   Gastroesophageal reflux disease 09/11/2021   Dysfunction of both eustachian tubes 09/11/2021   Fibromyalgia 07/01/2019   Vitamin D deficiency disease    Female hypogonadism syndrome    Asthma    Malaise and fatigue    Seasonal and perennial allergic rhinitis 12/19/2018   Not well controlled moderate persistent asthma 12/19/2018    History obtained from: chart review and patient.  Sabrina Clark is a 67 y.o. female presenting for a sick visit.  We last saw her in January 2024.  At that time, her lung testing looked great.  She was doing well with Trelegy which she added only during flares.  A lot of her asthma seems to be allergy mediated and improved with allergy shots, so we thought this was a safe thing to do.  For her environmental allergies, we continue with her allergy shots frozen at 0.15 mL.  We continue with Allegra as well as Nasacort.  We also continue with Ryaltris 1 spray per nostril up to twice daily.  We did give her a prescription for Fioricet to use as needed for  her migraines.  In the interim, she has had a rough go of it.  She got COVID around five weeks ago. Her husband got better after 5 days. But her symptoms have persisted. She started to feel better after another week. She remained on her Trelegy. She did started the Trelegy when she started to feel bad and she continued for two weeks. Then she stopped it again.   She had been on been off of the Trelegy daily every day for a few years now since being on her allergy shots. She had awful exhaustion and has not been  able to do her normal ADLs. She has tried to stay busy. She has slowly been getting better. She was still having issues.   She did have prednisone at the beginning when she started on the Trelegy. Then two days later her testing came back positive.  That is when Paxlovid was added.  She is now five weeks past contracting COVID-19.  She just thought that she would be better by this point in time. She did talk to her APNP and she was told that she has long COVID.  She is clearly very concerned about this.  She had a chest x-ray that was normal.  This was done on September 10 which showed no active cardiopulmonary disease.  She has not had a chest CT.  It does not look like she has ever had a chest CT.  She does not have an albuterol nebulizer at home.  She is open to trying that if we think that it would help her.  She does not really want to go on prednisone again, but she possibly could do that if she does not improve.  She is not really having any other symptoms aside from just extreme fatigue.  She has issues with doing her activities of daily living, so she has been doing crafts that do not require a lot of exertion.  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  She is just worried that she is not can get back to that.  Otherwise, there have been no changes to her past medical history, surgical history, family history, or social history.    Review of systems otherwise negative other than that mentioned in the HPI.    Objective:   Blood pressure 130/70, pulse 75, temperature 98.4 F (36.9 C), height 5' (1.524 m), weight 129 lb 3.2 oz (58.6 kg), SpO2 99%. Body mass index is 25.23 kg/m.    Physical Exam Vitals reviewed.  Constitutional:      Appearance: She is well-developed.     Comments: Seems dejected.  Definitely more anxious and have seen her in the past.  HENT:     Head: Normocephalic and atraumatic.     Right Ear: Tympanic membrane, ear canal and  external ear normal.     Left Ear: Tympanic membrane, ear canal and external ear normal.     Nose: No nasal deformity, septal deviation, mucosal edema or rhinorrhea.     Right Turbinates: Enlarged, swollen and pale.     Left Turbinates: Enlarged, swollen and pale.     Right Sinus: No maxillary sinus tenderness or frontal sinus tenderness.     Left Sinus: No maxillary sinus tenderness or frontal sinus tenderness.     Comments: No nasal polyps noted.     Mouth/Throat:     Lips: Pink.     Mouth: Mucous membranes are moist. Mucous membranes are not pale and  not dry.     Pharynx: Uvula midline.     Comments: Cobblestoning present in the posterior oropharynx.  Eyes:     General: Lids are normal. No allergic shiner.       Right eye: No discharge.        Left eye: No discharge.     Conjunctiva/sclera: Conjunctivae normal.     Right eye: Right conjunctiva is not injected. No chemosis.    Left eye: Left conjunctiva is not injected. No chemosis.    Pupils: Pupils are equal, round, and reactive to light.  Cardiovascular:     Rate and Rhythm: Normal rate and regular rhythm.     Heart sounds: Normal heart sounds.  Pulmonary:     Effort: Pulmonary effort is normal. No tachypnea, accessory muscle usage or respiratory distress.     Breath sounds: Normal breath sounds. No wheezing, rhonchi or rales.     Comments: Her lungs actually sound pretty good.  She does have some coarse upper airway noise that is projected to the lungs, but she is moving air well in all lung fields.  No wheezing noted. Chest:     Chest wall: No tenderness.  Lymphadenopathy:     Cervical: No cervical adenopathy.  Skin:    General: Skin is warm.     Capillary Refill: Capillary refill takes less than 2 seconds.     Coloration: Skin is not pale.     Findings: No abrasion, erythema, petechiae or rash. Rash is not papular, urticarial or vesicular.     Comments: No eczematous or urticarial lesions noted.  Neurological:      Mental Status: She is alert.  Psychiatric:        Behavior: Behavior is cooperative.      Diagnostic studies:    Spirometry: results normal (FEV1: 1.96/86%, FVC: 2.42/85%, FEV1/FVC: 81%).    Spirometry consistent with normal pattern.   Allergy Studies: none        Sabrina Bonds, MD  [facility]

## 2023-07-04 NOTE — Patient Instructions (Addendum)
1. Mild intermittent asthma, uncomplicated - Lung testing looked really good today. - Let's restart the Trelegy until the next visit. - Let's add on albuterol mixed with budesonide three times daily via the nebulizer to see if this helps at all. - We are going to do this for two weeks and we can see you in  then.  - I am going do some research on long COVID and see what the time table and definitions look like. - Daily controller medication(s): Trelegy one puff once daily - Prior to physical activity: albuterol 2 puffs 10-15 minutes before physical activity. - Rescue medications: albuterol 4 puffs every 4-6 hours as needed - Asthma control goals:  * Full participation in all desired activities (may need albuterol before activity) * Albuterol use two time or less a week on average (not counting use with activity) * Cough interfering with sleep two time or less a month * Oral steroids no more than once a year * No hospitalizations  2.Seasonal and perennial allergic rhinitis (ragweed, weeds, grasses, indoor molds, dust mites, cat and dog) - Let's talk about restarting allergy shots at the next visit in two weeks.  - Continue with: Allegra (fexofenadine) 180mg  table once daily and Nasacort (triamcinolone) one spray per nostril daily. - May take an extra dose of Allegra on the days your allergy symptoms are worse. - Continue with: on Ryaltris one spray per nostril up to twice daily   3. Return in about 2 weeks (around 07/18/2023).    Please inform us of any Emergency Department visits, hospitalizations, or changes in symptoms. Call us before going to the ED for breathing or allergy symptoms since we might be able to fit you in for a sick visit. Feel free to contact us anytime with any questions, problems, or concerns.  It was a pleasure to see you again today!    Websites that have reliable patient information: 1. American Academy of Asthma, Allergy, and Immunology:  www.aaaai.org 2. Food Allergy Research and Education (FARE): foodallergy.org 3. Mothers of Asthmatics: http://www.asthmacommunitynetwork.org 4. American College of Allergy, Asthma, and Immunology: www.acaai.org   COVID-19 Vaccine Information can be found at: PodExchange.nl For questions related to vaccine distribution or appointments, please email [email or web address] or call [phone].   We realize that you might be concerned about having an allergic reaction to the COVID19 vaccines. To help with that concern, WE ARE OFFERING THE COVID19 VACCINES IN OUR OFFICE! Ask the front desk for dates!     "Like" [place] on Facebook and Instagram for our latest updates!      A healthy democracy works best when [employer] participate! Make sure you are registered to vote! If you have moved or changed any of your contact information, you will need to get this updated before voting!  In some cases, you MAY be able to register to vote online: [email or web address]

## 2023-07-05 ENCOUNTER — Telehealth: Payer: Self-pay | Admitting: *Deleted

## 2023-07-05 NOTE — Telephone Encounter (Signed)
Noted thank you

## 2023-07-05 NOTE — Telephone Encounter (Signed)
-----   Message from Alfonse Spruce sent at 07/04/2023  6:01 PM EDT ----- Can we call her tomorrow to follow up and make sure that she is feeling better? I am not opposed to giving her a longer prednisone course. Thanks!

## 2023-07-05 NOTE — Telephone Encounter (Signed)
I called and spoke with the patient and she stated that she is feeling the same today but she isn't able to pick up her medication until after 2 today since the pharmacy had to order it. I advised that we will call her Monday to see how she is feeling and to call us if she needs anything over the weekend. Patient verbalized understanding.

## 2023-07-08 ENCOUNTER — Encounter: Payer: Self-pay | Admitting: Allergy & Immunology

## 2023-07-08 DIAGNOSIS — R87610 Atypical squamous cells of undetermined significance on cytologic smear of cervix (ASC-US): Secondary | ICD-10-CM | POA: Diagnosis not present

## 2023-07-08 DIAGNOSIS — N870 Mild cervical dysplasia: Secondary | ICD-10-CM | POA: Diagnosis not present

## 2023-07-08 NOTE — Telephone Encounter (Signed)
Forwarding patient myChart message to [facility] as update.

## 2023-07-17 ENCOUNTER — Encounter: Payer: Self-pay | Admitting: Allergy & Immunology

## 2023-07-17 ENCOUNTER — Ambulatory Visit: Payer: Medicare HMO | Admitting: Allergy & Immunology

## 2023-07-17 ENCOUNTER — Other Ambulatory Visit: Payer: Self-pay

## 2023-07-17 VITALS — BP 120/62 | HR 102 | Temp 97.2°F | Ht 60.0 in | Wt 130.6 lb

## 2023-07-17 DIAGNOSIS — K219 Gastro-esophageal reflux disease without esophagitis: Secondary | ICD-10-CM | POA: Diagnosis not present

## 2023-07-17 DIAGNOSIS — J3089 Other allergic rhinitis: Secondary | ICD-10-CM

## 2023-07-17 DIAGNOSIS — J4520 Mild intermittent asthma, uncomplicated: Secondary | ICD-10-CM

## 2023-07-17 DIAGNOSIS — J302 Other seasonal allergic rhinitis: Secondary | ICD-10-CM | POA: Diagnosis not present

## 2023-07-17 NOTE — Progress Notes (Signed)
FOLLOW UP  Date of Service/Encounter:  07/17/23   Assessment:   Moderate persistent asthma, uncomplicated - with improvement following her last visit   Seasonal and perennial allergic rhinitis (ragweed, weeds, grasses, indoor molds, dust mites, cat and dog) - on allergen immunotherapy with dose frozen at 0.15 mL of her RED Sabrina Clark is doing much better with the current regimen.  I now doubt whether she had long COVID at all.  Although she technically fit the duration of symptoms at the last visit with her PCP, she is clearly improved rather quickly if this was a long COVID.  She does have a good plan in place now for future asthmatic flares.  She will definitely talk to [place] when she gets sick so that we can reinforce this plan and help her to get over her illnesses and flares more quickly.  Plan/Recommendations:   1. Mild intermittent asthma, uncomplicated - Lung testing looked even better. - Quit the albuterol for now.  - Start the albuterol at the same time as your Trelegy.  - Daily controller medication(s): NOTHING - Prior to physical activity: albuterol 2 puffs 10-15 minutes before physical activity. - Rescue medications: albuterol 4 puffs every 4-6 hours as needed - During periods of respiratory illness: ADD ON Trelegy one puff once daily for 2 weeks in combination with albuterol nebulizer 2-3 times per day - Asthma control goals:  * Full participation in all desired activities (may need albuterol before activity) * Albuterol use two time or less a week on average (not counting use with activity) * Cough interfering with sleep two time or less a month * Oral steroids no more than once a year * No hospitalizations  2.Seasonal and perennial allergic rhinitis (ragweed, weeds, grasses, indoor molds, dust mites, cat and dog) - Allergy shots restarted today.  - Continue with: Allegra (fexofenadine) 180mg  table once daily and Nasacort (triamcinolone) one spray per nostril  daily. - May take an extra dose of Allegra on the days your allergy symptoms are worse. - Continue with: on Ryaltris one spray per nostril up to twice daily as needed   3. Return in about 6 months (around 01/15/2024).    Subjective:   Sabrina Clark is a 67 y.o. female presenting today for follow up of  Chief Complaint  Patient presents with   Follow-up    No concerns     Scheryl Sanborn has a history of the following: Patient Active Problem List   Diagnosis Date Noted   Fatigue 06/25/2023   Viral respiratory illness 06/04/2023   Encounter for general adult medical examination with abnormal findings 02/12/2023   Anosmia 09/25/2021   Ageusia 09/25/2021   Gastroesophageal reflux disease 09/11/2021   Dysfunction of both eustachian tubes 09/11/2021   Fibromyalgia 07/01/2019   Vitamin D deficiency disease    Female hypogonadism syndrome    Asthma    Malaise and fatigue    Seasonal and perennial allergic rhinitis 12/19/2018   Not well controlled moderate persistent asthma 12/19/2018    History obtained from: chart review and patient.  Discussed the use of AI scribe software for clinical note transcription with the patient, who gave verbal consent to proceed.  Sabrina Clark is a 67 y.o. female presenting for a follow up visit.  She was last seen on September 19 in the [place] office.  At that time, her lung testing looked very good.  We discussed what we are going to do regarding her continued fatigue and tightness  of breath following her COVID-19 infection.  We recommended restarting her Trelegy until the next visit and we added on albuterol mixed with budesonide 3 times a day to help deliver some anti-inflammatory activity directly to the lungs.  For her rhinitis, we held allergy shots because she was still sick.  Continue with Allegra as well as Nasacort.  We also continued with Ryaltris 1 spray per nostril up to twice daily.  Since last visit, she has done very well.  The patient, with a  history of respiratory issues, presents with concerns about her nebulizer treatment. She reports a foaming or bubbling reaction when using the nebulizer, which she found concerning. Despite contacting the manufacturer and trying different components, the issue persisted. The patient decided to discontinue the budesonide treatments due to these concerns and has been relying on albuterol instead.  She instead stuck to the albuterol only.  The patient reports feeling significantly better since starting the albuterol, with an estimated 80-85% improvement. She initially used the albuterol three times a day, but reduced to twice daily as her condition improved. However, she reports experiencing jitteriness after using albuterol, which lasted for about 30 minutes. The patient also mentions that she has not been coughing much, even when she was feeling very ill.  In addition to the albuterol, the patient has been using Trelegy and Ryaltris. She reports that she did not experience any severe symptoms or coughing after starting these medications. The patient is considering discontinuing the albuterol due to the side effects and because she feels she may not need it as much anymore.  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 She reports that she has been able to feed and interact with the goats, which she was unable to do when she was feeling ill.  She has 1 good in particular that is quite attentive to her.  This was the baby get that she help birth and she would go out routinely to cuddle it.  They are still best friends.  She also has another goat with a beard that she braids occasionally.    Otherwise, there have been no changes to her past medical history, surgical history, family history, or social history.    Review of systems otherwise negative other than that mentioned in the HPI.    Objective:   Blood pressure 120/62, pulse (!) 102,  temperature (!) 97.2 F (36.2 C), height 5' (1.524 m), weight 130 lb 9.6 oz (59.2 kg), SpO2 97%. Body mass index is 25.51 kg/m.    Physical Exam Vitals reviewed.  Constitutional:      Appearance: She is well-developed.     Comments: Much more energetic today than she was previously when I saw her 2 weeks ago.  Optimistic and smiling.  HENT:     Head: Normocephalic and atraumatic.     Right Ear: Tympanic membrane, ear canal and external ear normal.     Left Ear: Tympanic membrane, ear canal and external ear normal.     Nose: No nasal deformity, septal deviation, mucosal edema or rhinorrhea.     Right Turbinates: Enlarged, swollen and pale.     Left Turbinates: Enlarged, swollen and pale.     Right Sinus: No maxillary sinus tenderness or frontal sinus tenderness.     Left Sinus: No maxillary sinus tenderness or frontal sinus tenderness.     Comments: No nasal polyps noted.     Mouth/Throat:  Lips: Pink.     Mouth: Mucous membranes are moist. Mucous membranes are not pale and not dry.     Pharynx: Uvula midline.     Comments: Minimal cobblestoning present in the posterior oropharynx.  Eyes:     General: Lids are normal. No allergic shiner.       Right eye: No discharge.        Left eye: No discharge.     Conjunctiva/sclera: Conjunctivae normal.     Right eye: Right conjunctiva is not injected. No chemosis.    Left eye: Left conjunctiva is not injected. No chemosis.    Pupils: Pupils are equal, round, and reactive to light.  Cardiovascular:     Rate and Rhythm: Normal rate and regular rhythm.     Heart sounds: Normal heart sounds.  Pulmonary:     Effort: Pulmonary effort is normal. No tachypnea, bradypnea, accessory muscle usage or respiratory distress.     Breath sounds: Normal breath sounds. No wheezing, rhonchi or rales.     Comments: Moving air well in all lung fields.  No increased work of breathing. Chest:     Chest wall: No tenderness.  Lymphadenopathy:      Cervical: No cervical adenopathy.  Skin:    General: Skin is warm.     Capillary Refill: Capillary refill takes less than 2 seconds.     Coloration: Skin is not pale.     Findings: No abrasion, erythema, petechiae or rash. Rash is not papular, urticarial or vesicular.     Comments: No eczematous or urticarial lesions noted.  Neurological:     Mental Status: She is alert.  Psychiatric:        Behavior: Behavior is cooperative.      Diagnostic studies:    Spirometry: results normal (FEV1: 1.86/91%, FVC: 2.30/89%, FEV1/FVC: 81%).    Spirometry consistent with normal pattern.   Allergy Studies: none        Malachi Bonds, MD  [facility]

## 2023-07-17 NOTE — Patient Instructions (Addendum)
1. Mild intermittent asthma, uncomplicated - Lung testing looked even better. - Quit the albuterol for now.  - Start the albuterol at the same time as your Trelegy.  - Daily controller medication(s): NOTHING - Prior to physical activity: albuterol 2 puffs 10-15 minutes before physical activity. - Rescue medications: albuterol 4 puffs every 4-6 hours as needed - During periods of respiratory illness: ADD ON Trelegy one puff once daily for 2 weeks in combination with albuterol nebulizer 2-3 times per day - Asthma control goals:  * Full participation in all desired activities (may need albuterol before activity) * Albuterol use two time or less a week on average (not counting use with activity) * Cough interfering with sleep two time or less a month * Oral steroids no more than once a year * No hospitalizations  2.Seasonal and perennial allergic rhinitis (ragweed, weeds, grasses, indoor molds, dust mites, cat and dog) - Allergy shots restarted today.  - Continue with: Allegra (fexofenadine) 180mg  table once daily and Nasacort (triamcinolone) one spray per nostril daily. - May take an extra dose of Allegra on the days your allergy symptoms are worse. - Continue with: on Ryaltris one spray per nostril up to twice daily as needed   3. Return in about 6 months (around 01/15/2024).    Please inform us of any Emergency Department visits, hospitalizations, or changes in symptoms. Call us before going to the ED for breathing or allergy symptoms since we might be able to fit you in for a sick visit. Feel free to contact us anytime with any questions, problems, or concerns.  It was a pleasure to see you again today!    Websites that have reliable patient information: 1. American Academy of Asthma, Allergy, and Immunology: www.aaaai.org 2. Food Allergy Research and Education (FARE): foodallergy.org 3. Mothers of Asthmatics: http://www.asthmacommunitynetwork.org 4. American College of Allergy,  Asthma, and Immunology: www.acaai.org   COVID-19 Vaccine Information can be found at: [email or web address] For questions related to vaccine distribution or appointments, please email [email or web address] or call [phone].   We realize that you might be concerned about having an allergic reaction to the COVID19 vaccines. To help with that concern, WE ARE OFFERING THE COVID19 VACCINES IN OUR OFFICE! Ask the front desk for dates!     "Like" [place] on Facebook and Instagram for our latest updates!      A healthy democracy works best when [employer] participate! Make sure you are registered to vote! If you have moved or changed any of your contact information, you will need to get this updated before voting!  In some cases, you MAY be able to register to vote online: [email or web address]

## 2023-07-25 DIAGNOSIS — J3081 Allergic rhinitis due to animal (cat) (dog) hair and dander: Secondary | ICD-10-CM | POA: Diagnosis not present

## 2023-07-25 NOTE — Progress Notes (Signed)
VIALS EXP 07-24-24

## 2023-07-26 DIAGNOSIS — J301 Allergic rhinitis due to pollen: Secondary | ICD-10-CM | POA: Diagnosis not present

## 2023-08-12 ENCOUNTER — Ambulatory Visit: Payer: Medicare HMO | Admitting: Internal Medicine

## 2023-08-13 ENCOUNTER — Encounter: Payer: Self-pay | Admitting: Allergy & Immunology

## 2023-08-14 ENCOUNTER — Ambulatory Visit (INDEPENDENT_AMBULATORY_CARE_PROVIDER_SITE_OTHER): Payer: Self-pay

## 2023-08-14 DIAGNOSIS — J309 Allergic rhinitis, unspecified: Secondary | ICD-10-CM

## 2023-08-14 MED ORDER — EPINEPHRINE 0.3 MG/0.3ML IJ SOAJ: 0.3000 mg | INTRAMUSCULAR | 1 refills | Status: DC | PRN

## 2023-08-15 ENCOUNTER — Ambulatory Visit: Payer: Medicare HMO | Admitting: Internal Medicine

## 2023-08-21 ENCOUNTER — Ambulatory Visit (INDEPENDENT_AMBULATORY_CARE_PROVIDER_SITE_OTHER): Payer: Medicare HMO

## 2023-08-21 DIAGNOSIS — J309 Allergic rhinitis, unspecified: Secondary | ICD-10-CM

## 2023-08-22 ENCOUNTER — Ambulatory Visit (INDEPENDENT_AMBULATORY_CARE_PROVIDER_SITE_OTHER): Payer: Medicare HMO | Admitting: Internal Medicine

## 2023-08-22 ENCOUNTER — Encounter: Payer: Self-pay | Admitting: Internal Medicine

## 2023-08-22 VITALS — BP 154/75 | HR 82 | Ht 60.0 in | Wt 131.8 lb

## 2023-08-22 DIAGNOSIS — J4521 Mild intermittent asthma with (acute) exacerbation: Secondary | ICD-10-CM

## 2023-08-22 DIAGNOSIS — E2839 Other primary ovarian failure: Secondary | ICD-10-CM | POA: Diagnosis not present

## 2023-08-22 DIAGNOSIS — K219 Gastro-esophageal reflux disease without esophagitis: Secondary | ICD-10-CM

## 2023-08-22 MED ORDER — PANTOPRAZOLE SODIUM 40 MG PO TBEC: 40.0000 mg | DELAYED_RELEASE_TABLET | Freq: Every day | ORAL | 1 refills | Status: DC

## 2023-08-22 NOTE — Progress Notes (Signed)
Established Patient Office Visit  Subjective   Patient ID: Sabrina Clark, female    DOB: 1956-09-05  Age: 67 y.o. MRN: [id number]  Chief Complaint  Patient presents with   Gastroesophageal Reflux    Follow up, patient states she having discomfort, worse after eating    Back Pain    Pain in the middle of her back    Sabrina Clark returns to care today for routine follow-up.  She was last evaluated by me on 4/30 as a new patient presenting to establish care.  No medication changes were made at that time and 45-month follow-up was arranged.  In the interim, she has been evaluated at [facility] on multiple occasions for acute visits, 9/10 most recently in the setting of fatigue.  She has also been seen by allergy and immunology for follow-up.  There have otherwise been no acute interval events.  Sabrina Clark reports feeling fairly well today.  She endorses 1 week history of reflux symptoms and nausea.  Symptoms have previously been relieved with Tagamet, Tums, and Mylanta.  She has not been able to identify any exacerbating or alleviating factors.  She is interested in additional treatment options.  She is otherwise asymptomatic and has no acute concerns to discuss today.  Past Medical History:  Diagnosis Date   Asthma    Female hypogonadism syndrome    Fibromyalgia 07/01/2019   Malaise and fatigue    Vitamin D deficiency disease    Past Surgical History:  Procedure Laterality Date   BREAST BIOPSY Left 2019   x3   BREAST CYST ASPIRATION     CYSTECTOMY     Social History   Tobacco Use   Smoking status: Never   Smokeless tobacco: Never  Vaping Use   Vaping status: Never Used  Substance Use Topics   Alcohol use: Yes    Alcohol/week: 4.0 standard drinks of alcohol    Types: 2 Glasses of wine, 2 Shots of liquor per week    Comment: sometimes less   Drug use: No   Family History  Problem Relation Age of Onset   Graves' disease Mother    Fibromyalgia Mother    Allergic rhinitis Father     Cancer Father    Diabetes Father    Breast cancer Neg Hx    Angioedema Neg Hx    Asthma Neg Hx    Atopy Neg Hx    Eczema Neg Hx    Immunodeficiency Neg Hx    Urticaria Neg Hx    Allergies  Allergen Reactions   Codeine Hives    Itch     Cefdinir    Diphenhydramine Hcl Hives   Review of Systems  Constitutional:  Negative for chills and fever.  HENT:  Negative for sore throat.   Respiratory:  Negative for cough and shortness of breath.   Cardiovascular:  Negative for chest pain, palpitations and leg swelling.  Gastrointestinal:  Positive for heartburn and nausea. Negative for abdominal pain, blood in stool, constipation, diarrhea and vomiting.  Genitourinary:  Negative for dysuria and hematuria.  Musculoskeletal:  Negative for myalgias.  Skin:  Negative for itching and rash.  Neurological:  Negative for dizziness and headaches.  Psychiatric/Behavioral:  Negative for depression and suicidal ideas.      Objective:     BP (!) 154/75 (BP Location: Left Arm, Patient Position: Sitting, Cuff Size: Normal)   Pulse 82   Ht 5' (1.524 m)   Wt 131 lb 12.8 oz (59.8 kg)  SpO2 97%   BMI 25.74 kg/m  BP Readings from Last 3 Encounters:  08/22/23 (!) 154/75  07/17/23 120/62  07/04/23 130/70   Physical Exam Vitals reviewed.  Constitutional:      General: She is not in acute distress.    Appearance: Normal appearance. She is not toxic-appearing.  HENT:     Head: Normocephalic and atraumatic.     Right Ear: External ear normal.     Left Ear: External ear normal.     Nose: Nose normal. No congestion or rhinorrhea.     Mouth/Throat:     Mouth: Mucous membranes are moist.     Pharynx: Oropharynx is clear. No oropharyngeal exudate or posterior oropharyngeal erythema.  Eyes:     General: No scleral icterus.    Extraocular Movements: Extraocular movements intact.     Conjunctiva/sclera: Conjunctivae normal.     Pupils: Pupils are equal, round, and reactive to light.   Cardiovascular:     Rate and Rhythm: Normal rate and regular rhythm.     Pulses: Normal pulses.     Heart sounds: Normal heart sounds. No murmur heard.    No friction rub. No gallop.  Pulmonary:     Effort: Pulmonary effort is normal.     Breath sounds: Normal breath sounds. No wheezing, rhonchi or rales.  Abdominal:     General: Abdomen is flat. Bowel sounds are normal. There is no distension.     Palpations: Abdomen is soft.     Tenderness: There is no abdominal tenderness.  Musculoskeletal:        General: No swelling. Normal range of motion.     Cervical back: Normal range of motion.     Right lower leg: No edema.     Left lower leg: No edema.  Lymphadenopathy:     Cervical: No cervical adenopathy.  Skin:    General: Skin is warm and dry.     Capillary Refill: Capillary refill takes less than 2 seconds.     Coloration: Skin is not jaundiced.  Neurological:     General: No focal deficit present.     Mental Status: She is alert and oriented to person, place, and time.  Psychiatric:        Mood and Affect: Mood normal.        Behavior: Behavior normal.    Results for orders placed or performed in visit on 08/22/23  Cologuard  Result Value Ref Range   Cologuard Negative Negative   Last CBC Lab Results  Component Value Date   WBC 8.0 06/25/2023   HGB 12.7 06/25/2023   HCT 39.1 06/25/2023   MCV 92 06/25/2023   MCH 30.0 06/25/2023   RDW 12.5 06/25/2023   PLT 299 06/25/2023   Last metabolic panel Lab Results  Component Value Date   GLUCOSE 102 (H) 06/25/2023   NA 139 06/25/2023   K 4.8 06/25/2023   CL 104 06/25/2023   CO2 26 06/25/2023   BUN 8 06/25/2023   CREATININE 0.77 06/25/2023   EGFR 86 06/25/2023   CALCIUM 9.2 06/25/2023   PROT 6.8 10/09/2020   ALBUMIN 3.7 10/09/2020   BILITOT 0.4 10/09/2020   ALKPHOS 48 10/09/2020   AST 20 10/09/2020   ALT 13 10/09/2020   ANIONGAP 4 (L) 05/03/2022   Last lipids Lab Results  Component Value Date   CHOL 165  07/01/2019   HDL 63 07/01/2019   LDLCALC 86 07/01/2019   TRIG 69 07/01/2019   CHOLHDL 2.6 07/01/2019  Last hemoglobin A1c Lab Results  Component Value Date   HGBA1C 5.2 07/01/2019   Last thyroid functions Lab Results  Component Value Date   TSH 0.090 (L) 06/25/2023   Last vitamin D Lab Results  Component Value Date   VD25OH >120 05/26/2021     Assessment & Plan:   Problem List Items Addressed This Visit       Asthma    She is asymptomatic currently.  Pulmonary exam is unremarkable.  Reports that she is off all inhalers/nebulizers.      Gastroesophageal reflux disease - Primary    Her acute concern today is worsening GERD symptoms.  Symptoms have previously been well-controlled with as needed use of Tagamet, Tums, and Mylanta.  Symptoms have been worse for the last week and have been associated with nausea.  She has not identified any exacerbating or alleviating factors. -Trial Protonix 40 mg daily.  Okay to increase frequency to twice daily if no significant improvement after 2-3 days. -She was instructed to return if your symptoms continue to worsen or fail to improve with daily PPI use.      Female hypogonadism syndrome    She continues to follow with Delrae Rend MD and remains on testosterone, estrogen, and progesterone supplements.  She is also prescribed NP thyroid.  TSH low at 0.09 on labs from September.  She was instructed to inform her provider at [facility] about this result.      Return in about 6 months (around 02/19/2024).   Billie Lade, MD

## 2023-08-22 NOTE — Patient Instructions (Signed)
It was a pleasure to see you today.  Thank you for giving [place] the opportunity to be involved in your care.  Below is a brief recap of your visit and next steps.  We will plan to see you again in 6 months.  Summary Try Protonix 40 mg daily for reflux symptoms. OK to increase to twice daily after 2-3 days if no improvement. Follow up in 6 months

## 2023-09-03 DIAGNOSIS — M8588 Other specified disorders of bone density and structure, other site: Secondary | ICD-10-CM | POA: Diagnosis not present

## 2023-09-03 DIAGNOSIS — N958 Other specified menopausal and perimenopausal disorders: Secondary | ICD-10-CM | POA: Diagnosis not present

## 2023-09-04 ENCOUNTER — Ambulatory Visit (INDEPENDENT_AMBULATORY_CARE_PROVIDER_SITE_OTHER): Payer: Self-pay

## 2023-09-04 DIAGNOSIS — J309 Allergic rhinitis, unspecified: Secondary | ICD-10-CM

## 2023-09-13 NOTE — Assessment & Plan Note (Signed)
She continues to follow with Delrae Rend MD and remains on testosterone, estrogen, and progesterone supplements.  She is also prescribed NP thyroid.  TSH low at 0.09 on labs from September.  She was instructed to inform her provider at [facility] about this result.

## 2023-09-13 NOTE — Assessment & Plan Note (Signed)
Her acute concern today is worsening GERD symptoms.  Symptoms have previously been well-controlled with as needed use of Tagamet, Tums, and Mylanta.  Symptoms have been worse for the last week and have been associated with nausea.  She has not identified any exacerbating or alleviating factors. -Trial Protonix 40 mg daily.  Okay to increase frequency to twice daily if no significant improvement after 2-3 days. -She was instructed to return if your symptoms continue to worsen or fail to improve with daily PPI use.

## 2023-09-13 NOTE — Assessment & Plan Note (Signed)
She is asymptomatic currently.  Pulmonary exam is unremarkable.  Reports that she is off all inhalers/nebulizers.

## 2023-09-16 DIAGNOSIS — Z6824 Body mass index (BMI) 24.0-24.9, adult: Secondary | ICD-10-CM | POA: Diagnosis not present

## 2023-09-16 DIAGNOSIS — M2559 Pain in other specified joint: Secondary | ICD-10-CM | POA: Diagnosis not present

## 2023-09-16 DIAGNOSIS — N951 Menopausal and female climacteric states: Secondary | ICD-10-CM | POA: Diagnosis not present

## 2023-09-16 DIAGNOSIS — R6882 Decreased libido: Secondary | ICD-10-CM | POA: Diagnosis not present

## 2023-09-18 ENCOUNTER — Ambulatory Visit (INDEPENDENT_AMBULATORY_CARE_PROVIDER_SITE_OTHER): Payer: Self-pay

## 2023-09-18 DIAGNOSIS — J309 Allergic rhinitis, unspecified: Secondary | ICD-10-CM

## 2023-09-24 DIAGNOSIS — N951 Menopausal and female climacteric states: Secondary | ICD-10-CM | POA: Diagnosis not present

## 2023-09-24 DIAGNOSIS — E039 Hypothyroidism, unspecified: Secondary | ICD-10-CM | POA: Diagnosis not present

## 2023-09-27 ENCOUNTER — Telehealth: Payer: Self-pay | Admitting: Allergy & Immunology

## 2023-09-27 ENCOUNTER — Other Ambulatory Visit: Payer: Self-pay | Admitting: *Deleted

## 2023-09-27 ENCOUNTER — Ambulatory Visit (INDEPENDENT_AMBULATORY_CARE_PROVIDER_SITE_OTHER): Payer: Self-pay

## 2023-09-27 DIAGNOSIS — J309 Allergic rhinitis, unspecified: Secondary | ICD-10-CM | POA: Diagnosis not present

## 2023-09-27 MED ORDER — RYALTRIS 665-25 MCG/ACT NA SUSP: 2.0000 | Freq: Two times a day (BID) | NASAL | 5 refills | Status: DC

## 2023-09-27 NOTE — Telephone Encounter (Signed)
Patient called stating she needs a refill for Ryaltris sent to [facility] in [place].

## 2023-09-27 NOTE — Telephone Encounter (Signed)
Prescription refill has been sent in. Called and left a voicemail asking for return call to inform.

## 2023-09-30 DIAGNOSIS — R6882 Decreased libido: Secondary | ICD-10-CM | POA: Diagnosis not present

## 2023-09-30 DIAGNOSIS — E039 Hypothyroidism, unspecified: Secondary | ICD-10-CM | POA: Diagnosis not present

## 2023-09-30 DIAGNOSIS — N951 Menopausal and female climacteric states: Secondary | ICD-10-CM | POA: Diagnosis not present

## 2023-09-30 DIAGNOSIS — M2559 Pain in other specified joint: Secondary | ICD-10-CM | POA: Diagnosis not present

## 2023-09-30 DIAGNOSIS — Z6825 Body mass index (BMI) 25.0-25.9, adult: Secondary | ICD-10-CM | POA: Diagnosis not present

## 2023-10-02 ENCOUNTER — Ambulatory Visit (INDEPENDENT_AMBULATORY_CARE_PROVIDER_SITE_OTHER): Payer: Medicare HMO

## 2023-10-02 DIAGNOSIS — J309 Allergic rhinitis, unspecified: Secondary | ICD-10-CM | POA: Diagnosis not present

## 2023-10-02 MED ORDER — RYALTRIS 665-25 MCG/ACT NA SUSP: 2.0000 | Freq: Two times a day (BID) | NASAL | 5 refills | Status: AC

## 2023-10-02 NOTE — Addendum Note (Signed)
Addended by: Robet Leu A on: 10/02/2023 11:30 AM   Modules accepted: Orders

## 2023-10-07 ENCOUNTER — Other Ambulatory Visit (HOSPITAL_COMMUNITY): Payer: Self-pay

## 2023-10-07 ENCOUNTER — Telehealth: Payer: Self-pay

## 2023-10-07 NOTE — Telephone Encounter (Signed)
Pharmacy Patient Advocate Encounter   Received notification from CoverMyMeds that prior authorization for Ryaltris 665-25MCG/ACT suspension is required/requested.   Insurance verification completed.   The patient is insured through [employer] .   Per test claim: PA required; PA submitted to above mentioned insurance via CoverMyMeds Key/confirmation #/EOC [facility] Status is pending

## 2023-10-07 NOTE — Telephone Encounter (Signed)
Pharmacy Patient Advocate Encounter  Received notification from [facility] that Prior Authorization for Ryaltris 665-25MCG/ACT suspension has been DENIED.  Full denial letter will be uploaded to the media tab. See denial reason below.  We are unable to cover this drug under your Part D Benefit. The drug you requested is not on the PDL. The drugs you may not have tried are:  Azelastine 137 mcg (0.1%) nasal spray aerosol Flunisolide nasal spray Your provider needs to give [place] medical reasons why the preferred drug(s) would not work for you and/or would have bad side effects. Sometimes a preferred drug may need more review for approval.   PA #/Case ID/Reference #: [facility]

## 2023-10-08 MED ORDER — FLUNISOLIDE 25 MCG/ACT (0.025%) NA SOLN: 2.0000 | Freq: Two times a day (BID) | NASAL | 5 refills | Status: DC

## 2023-10-08 MED ORDER — AZELASTINE HCL 0.15 % NA SOLN: 2.0000 | Freq: Two times a day (BID) | NASAL | 5 refills | Status: DC

## 2023-10-08 NOTE — Telephone Encounter (Signed)
Ok - I sent those two medications in. Can someone call and let the patient know and please wish her a Altamese Cabal Christmas!

## 2023-10-08 NOTE — Telephone Encounter (Signed)
Spoke with patient. She stated she already received the Ryaltris and the pharmacy told her it was covered. She was charged $35.

## 2023-10-30 ENCOUNTER — Ambulatory Visit (INDEPENDENT_AMBULATORY_CARE_PROVIDER_SITE_OTHER): Payer: Self-pay

## 2023-10-30 DIAGNOSIS — J309 Allergic rhinitis, unspecified: Secondary | ICD-10-CM | POA: Diagnosis not present

## 2023-11-11 DIAGNOSIS — H2512 Age-related nuclear cataract, left eye: Secondary | ICD-10-CM | POA: Diagnosis not present

## 2023-11-22 ENCOUNTER — Ambulatory Visit: Payer: Self-pay | Admitting: Family Medicine

## 2023-11-28 DIAGNOSIS — N951 Menopausal and female climacteric states: Secondary | ICD-10-CM | POA: Diagnosis not present

## 2023-11-28 DIAGNOSIS — E039 Hypothyroidism, unspecified: Secondary | ICD-10-CM | POA: Diagnosis not present

## 2023-11-29 DIAGNOSIS — H25812 Combined forms of age-related cataract, left eye: Secondary | ICD-10-CM | POA: Diagnosis not present

## 2023-11-29 DIAGNOSIS — H52222 Regular astigmatism, left eye: Secondary | ICD-10-CM | POA: Diagnosis not present

## 2023-12-03 DIAGNOSIS — Z6824 Body mass index (BMI) 24.0-24.9, adult: Secondary | ICD-10-CM | POA: Diagnosis not present

## 2023-12-03 DIAGNOSIS — R6882 Decreased libido: Secondary | ICD-10-CM | POA: Diagnosis not present

## 2023-12-03 DIAGNOSIS — M2559 Pain in other specified joint: Secondary | ICD-10-CM | POA: Diagnosis not present

## 2023-12-03 DIAGNOSIS — N951 Menopausal and female climacteric states: Secondary | ICD-10-CM | POA: Diagnosis not present

## 2023-12-04 DIAGNOSIS — H2511 Age-related nuclear cataract, right eye: Secondary | ICD-10-CM | POA: Diagnosis not present

## 2023-12-06 DIAGNOSIS — H52221 Regular astigmatism, right eye: Secondary | ICD-10-CM | POA: Diagnosis not present

## 2023-12-06 DIAGNOSIS — H25811 Combined forms of age-related cataract, right eye: Secondary | ICD-10-CM | POA: Diagnosis not present

## 2023-12-11 ENCOUNTER — Ambulatory Visit (INDEPENDENT_AMBULATORY_CARE_PROVIDER_SITE_OTHER): Payer: Self-pay

## 2023-12-11 DIAGNOSIS — J309 Allergic rhinitis, unspecified: Secondary | ICD-10-CM

## 2023-12-26 ENCOUNTER — Other Ambulatory Visit: Payer: Self-pay | Admitting: Obstetrics and Gynecology

## 2023-12-26 DIAGNOSIS — N6310 Unspecified lump in the right breast, unspecified quadrant: Secondary | ICD-10-CM

## 2023-12-26 DIAGNOSIS — N6320 Unspecified lump in the left breast, unspecified quadrant: Secondary | ICD-10-CM | POA: Diagnosis not present

## 2024-01-06 ENCOUNTER — Ambulatory Visit (INDEPENDENT_AMBULATORY_CARE_PROVIDER_SITE_OTHER): Payer: Self-pay | Admitting: Internal Medicine

## 2024-01-06 ENCOUNTER — Encounter: Payer: Self-pay | Admitting: Internal Medicine

## 2024-01-06 VITALS — BP 125/82 | HR 73 | Ht 60.0 in | Wt 133.6 lb

## 2024-01-06 DIAGNOSIS — M797 Fibromyalgia: Secondary | ICD-10-CM

## 2024-01-06 DIAGNOSIS — J4521 Mild intermittent asthma with (acute) exacerbation: Secondary | ICD-10-CM | POA: Diagnosis not present

## 2024-01-06 DIAGNOSIS — J3089 Other allergic rhinitis: Secondary | ICD-10-CM | POA: Diagnosis not present

## 2024-01-06 DIAGNOSIS — J302 Other seasonal allergic rhinitis: Secondary | ICD-10-CM

## 2024-01-06 DIAGNOSIS — K219 Gastro-esophageal reflux disease without esophagitis: Secondary | ICD-10-CM | POA: Diagnosis not present

## 2024-01-06 DIAGNOSIS — E2839 Other primary ovarian failure: Secondary | ICD-10-CM

## 2024-01-06 DIAGNOSIS — E559 Vitamin D deficiency, unspecified: Secondary | ICD-10-CM

## 2024-01-06 NOTE — Progress Notes (Signed)
 Established Patient Office Visit  Subjective   Patient ID: Sabrina Clark, female    DOB: 1955-11-27  Age: 68 y.o. MRN: [id number]  Chief Complaint  Patient presents with   Care Management    Pt. States she stopped her hormone medications, also pt is here for general check up with blood work   Sabrina Clark returns to care today for routine follow-up.  She was last evaluated by me in November 2024.  She endorsed worsening GERD symptoms at that time.  I recommended trialing Protonix  40 mg daily.  No additional medication changes were made and 33-month follow-up was arranged.  There have been no acute interval events.  Today she states that she stopped all hormone replacement therapy and feels well.  She would like to update labs but otherwise has no acute concerns to discuss.  Past Medical History:  Diagnosis Date   Asthma    Female hypogonadism syndrome    Fibromyalgia 07/01/2019   Malaise and fatigue    Vitamin D  deficiency disease    Past Surgical History:  Procedure Laterality Date   BREAST BIOPSY Left 2019   x3   BREAST CYST ASPIRATION     CYSTECTOMY     Social History   Tobacco Use   Smoking status: Never   Smokeless tobacco: Never  Vaping Use   Vaping status: Never Used  Substance Use Topics   Alcohol use: Yes    Alcohol/week: 4.0 standard drinks of alcohol    Types: 2 Glasses of wine, 2 Shots of liquor per week    Comment: sometimes less   Drug use: No   Family History  Problem Relation Age of Onset   Graves' disease Mother    Fibromyalgia Mother    Allergic rhinitis Father    Cancer Father    Diabetes Father    Breast cancer Neg Hx    Angioedema Neg Hx    Asthma Neg Hx    Atopy Neg Hx    Eczema Neg Hx    Immunodeficiency Neg Hx    Urticaria Neg Hx    Allergies  Allergen Reactions   Codeine Hives    Itch     Cefdinir    Diphenhydramine Hcl Hives   Review of Systems  Constitutional:  Negative for chills and fever.  HENT:  Negative for sore  throat.   Respiratory:  Negative for cough and shortness of breath.   Cardiovascular:  Negative for chest pain, palpitations and leg swelling.  Gastrointestinal:  Negative for abdominal pain, blood in stool, constipation, diarrhea, nausea and vomiting.  Genitourinary:  Negative for dysuria and hematuria.  Musculoskeletal:  Negative for myalgias.  Skin:  Negative for itching and rash.  Neurological:  Negative for dizziness and headaches.  Psychiatric/Behavioral:  Negative for depression and suicidal ideas.      Objective:     BP 125/82 (BP Location: Left Arm, Patient Position: Sitting, Cuff Size: Normal)   Pulse 73   Ht 5' (1.524 m)   Wt 133 lb 9.6 oz (60.6 kg)   SpO2 97%   BMI 26.09 kg/m  BP Readings from Last 3 Encounters:  01/17/24 118/60  01/06/24 125/82  08/22/23 (!) 154/75   Physical Exam Vitals reviewed.  Constitutional:      General: She is not in acute distress.    Appearance: Normal appearance. She is not toxic-appearing.  HENT:     Head: Normocephalic and atraumatic.     Right Ear: External ear normal.  Left Ear: External ear normal.     Nose: Nose normal. No congestion or rhinorrhea.     Mouth/Throat:     Mouth: Mucous membranes are moist.     Pharynx: Oropharynx is clear. No oropharyngeal exudate or posterior oropharyngeal erythema.  Eyes:     General: No scleral icterus.    Extraocular Movements: Extraocular movements intact.     Conjunctiva/sclera: Conjunctivae normal.     Pupils: Pupils are equal, round, and reactive to light.  Cardiovascular:     Rate and Rhythm: Normal rate and regular rhythm.     Pulses: Normal pulses.     Heart sounds: Normal heart sounds. No murmur heard.    No friction rub. No gallop.  Pulmonary:     Effort: Pulmonary effort is normal.     Breath sounds: Normal breath sounds. No wheezing, rhonchi or rales.  Abdominal:     General: Abdomen is flat. Bowel sounds are normal. There is no distension.     Palpations: Abdomen is  soft.     Tenderness: There is no abdominal tenderness.  Musculoskeletal:        General: No swelling. Normal range of motion.     Cervical back: Normal range of motion.     Right lower leg: No edema.     Left lower leg: No edema.  Lymphadenopathy:     Cervical: No cervical adenopathy.  Skin:    General: Skin is warm and dry.     Capillary Refill: Capillary refill takes less than 2 seconds.     Coloration: Skin is not jaundiced.  Neurological:     General: No focal deficit present.     Mental Status: She is alert and oriented to person, place, and time.  Psychiatric:        Mood and Affect: Mood normal.        Behavior: Behavior normal.   Last CBC Lab Results  Component Value Date   WBC 9.2 01/06/2024   HGB 12.9 01/06/2024   HCT 38.5 01/06/2024   MCV 91 01/06/2024   MCH 30.6 01/06/2024   RDW 12.4 01/06/2024   PLT 336 01/06/2024   Last metabolic panel Lab Results  Component Value Date   GLUCOSE 87 01/06/2024   NA 140 01/06/2024   K 4.5 01/06/2024   CL 103 01/06/2024   CO2 25 01/06/2024   BUN 8 01/06/2024   CREATININE 0.85 01/06/2024   EGFR 76 01/06/2024   CALCIUM 9.3 01/06/2024   PROT 6.8 01/06/2024   ALBUMIN 4.2 01/06/2024   LABGLOB 2.6 01/06/2024   BILITOT <0.2 01/06/2024   ALKPHOS 102 01/06/2024   AST 22 01/06/2024   ALT 11 01/06/2024   ANIONGAP 4 (L) 05/03/2022   Last lipids Lab Results  Component Value Date   CHOL 205 (H) 01/06/2024   HDL 79 01/06/2024   LDLCALC 112 (H) 01/06/2024   TRIG 81 01/06/2024   CHOLHDL 2.6 01/06/2024   Last hemoglobin A1c Lab Results  Component Value Date   HGBA1C 5.5 01/06/2024   Last thyroid  functions Lab Results  Component Value Date   TSH 2.770 01/06/2024   Last vitamin D  Lab Results  Component Value Date   VD25OH 49.2 01/06/2024   Last vitamin B12 and Folate Lab Results  Component Value Date   VITAMINB12 465 01/06/2024   FOLATE 8.8 01/06/2024   The 10-year ASCVD risk score (Arnett DK, et al., 2019)  is: 4.7%    Assessment & Plan:   Problem List Items  Addressed This Visit       Seasonal and perennial allergic rhinitis   Followed by allergy  and immunology.  Symptoms are well-controlled with Ryaltris , Nasacort , and Allegra .      Asthma   Asymptomatic currently.  Pulmonary exam is unremarkable.  She is using Trelegy daily and albuterol  inhaler/nebulizer as needed, which is infrequent.      Gastroesophageal reflux disease   Asymptomatic currently.  Protonix  40 mg daily was trialed at her last appointment.  She reports that she stopped taking it after symptoms resolved.  Symptoms have not recurred off PPI.      Female hypogonadism syndrome   Previously followed by Marykay Snipes MD and was prescribed testosterone , estrogen, and progesterone  supplements.  She states that she stopped all hormone replacement therapy recently and feels well off medication.  She would like to update basic labs today.      Return in about 6 months (around 07/08/2024).   Tobi Fortes, MD

## 2024-01-06 NOTE — Patient Instructions (Signed)
 It was a pleasure to see you today.  Thank you for giving [place] the opportunity to be involved in your care.  Below is a brief recap of your visit and next steps.  We will plan to see you again in 6 months.  Summary No medication changes today Repeat labs ordered Follow up in 6 months

## 2024-01-07 ENCOUNTER — Encounter: Payer: Self-pay | Admitting: Internal Medicine

## 2024-01-07 LAB — B12 AND FOLATE PANEL
Folate: 8.8 ng/mL
Vitamin B-12: 465 pg/mL (ref 232–1245)

## 2024-01-07 LAB — CMP14+EGFR
ALT: 11 IU/L (ref 0–32)
AST: 22 IU/L (ref 0–40)
Albumin: 4.2 g/dL (ref 3.9–4.9)
Alkaline Phosphatase: 102 IU/L (ref 44–121)
BUN/Creatinine Ratio: 9 — ABNORMAL LOW (ref 12–28)
BUN: 8 mg/dL (ref 8–27)
Bilirubin Total: <0.2 mg/dL (ref 0.0–1.2)
CO2: 25 mmol/L (ref 20–29)
Calcium: 9.3 mg/dL (ref 8.7–10.3)
Chloride: 103 mmol/L (ref 96–106)
Creatinine, Ser: 0.85 mg/dL (ref 0.57–1.00)
Globulin, Total: 2.6 g/dL (ref 1.5–4.5)
Glucose: 87 mg/dL (ref 70–99)
Potassium: 4.5 mmol/L (ref 3.5–5.2)
Sodium: 140 mmol/L (ref 134–144)
Total Protein: 6.8 g/dL (ref 6.0–8.5)
eGFR: 76 mL/min/{1.73_m2} (ref >59)

## 2024-01-07 LAB — CBC WITH DIFFERENTIAL/PLATELET
Basophils Absolute: 0.1 10*3/uL (ref 0.0–0.2)
Basos: 1 %
EOS (ABSOLUTE): 0.1 10*3/uL (ref 0.0–0.4)
Eos: 2 %
Hematocrit: 38.5 % (ref 34.0–46.6)
Hemoglobin: 12.9 g/dL (ref 11.1–15.9)
Immature Grans (Abs): 0 10*3/uL (ref 0.0–0.1)
Immature Granulocytes: 0 %
Lymphocytes Absolute: 3.3 10*3/uL — ABNORMAL HIGH (ref 0.7–3.1)
Lymphs: 36 %
MCH: 30.6 pg (ref 26.6–33.0)
MCHC: 33.5 g/dL (ref 31.5–35.7)
MCV: 91 fL (ref 79–97)
Monocytes Absolute: 0.8 10*3/uL (ref 0.1–0.9)
Monocytes: 9 %
Neutrophils Absolute: 4.9 10*3/uL (ref 1.4–7.0)
Neutrophils: 52 %
Platelets: 336 10*3/uL (ref 150–450)
RBC: 4.21 x10E6/uL (ref 3.77–5.28)
RDW: 12.4 % (ref 11.7–15.4)
WBC: 9.2 10*3/uL (ref 3.4–10.8)

## 2024-01-07 LAB — HEMOGLOBIN A1C
Est. average glucose Bld gHb Est-mCnc: 111 mg/dL
Hgb A1c MFr Bld: 5.5 % (ref 4.8–5.6)

## 2024-01-07 LAB — TSH+FREE T4
Free T4: 1.02 ng/dL (ref 0.82–1.77)
TSH: 2.77 u[IU]/mL (ref 0.450–4.500)

## 2024-01-07 LAB — LIPID PANEL
Chol/HDL Ratio: 2.6 ratio (ref 0.0–4.4)
Cholesterol, Total: 205 mg/dL — ABNORMAL HIGH (ref 100–199)
HDL: 79 mg/dL (ref >39)
LDL Chol Calc (NIH): 112 mg/dL — ABNORMAL HIGH (ref 0–99)
Triglycerides: 81 mg/dL (ref 0–149)
VLDL Cholesterol Cal: 14 mg/dL (ref 5–40)

## 2024-01-07 LAB — VITAMIN D 25 HYDROXY (VIT D DEFICIENCY, FRACTURES): Vit D, 25-Hydroxy: 49.2 ng/mL (ref 30.0–100.0)

## 2024-01-08 ENCOUNTER — Ambulatory Visit (INDEPENDENT_AMBULATORY_CARE_PROVIDER_SITE_OTHER): Payer: Self-pay

## 2024-01-08 DIAGNOSIS — J309 Allergic rhinitis, unspecified: Secondary | ICD-10-CM | POA: Diagnosis not present

## 2024-01-14 ENCOUNTER — Ambulatory Visit
Admission: RE | Admit: 2024-01-14 | Discharge: 2024-01-14 | Disposition: A | Discharge status: Home / Self Care | Source: Ambulatory Visit | Attending: Obstetrics and Gynecology | Admitting: Obstetrics and Gynecology

## 2024-01-14 DIAGNOSIS — N6310 Unspecified lump in the right breast, unspecified quadrant: Secondary | ICD-10-CM

## 2024-01-15 ENCOUNTER — Ambulatory Visit: Payer: Medicare HMO | Admitting: Allergy & Immunology

## 2024-01-17 ENCOUNTER — Encounter: Payer: Self-pay | Admitting: Allergy & Immunology

## 2024-01-17 ENCOUNTER — Other Ambulatory Visit: Payer: Self-pay

## 2024-01-17 ENCOUNTER — Ambulatory Visit (INDEPENDENT_AMBULATORY_CARE_PROVIDER_SITE_OTHER): Admitting: Allergy & Immunology

## 2024-01-17 VITALS — BP 118/60 | HR 88 | Temp 98.0°F | Resp 16 | Ht 60.0 in

## 2024-01-17 DIAGNOSIS — J3089 Other allergic rhinitis: Secondary | ICD-10-CM | POA: Diagnosis not present

## 2024-01-17 DIAGNOSIS — J302 Other seasonal allergic rhinitis: Secondary | ICD-10-CM | POA: Diagnosis not present

## 2024-01-17 DIAGNOSIS — J4520 Mild intermittent asthma, uncomplicated: Secondary | ICD-10-CM

## 2024-01-17 DIAGNOSIS — K219 Gastro-esophageal reflux disease without esophagitis: Secondary | ICD-10-CM

## 2024-01-17 NOTE — Progress Notes (Signed)
 FOLLOW UP  Date of Service/Encounter:  01/17/24   Assessment:   Moderate persistent asthma, uncomplicated - with improvement following her last visit    Seasonal and perennial allergic rhinitis (ragweed, weeds, grasses, indoor molds, dust mites, cat and dog) - on allergen immunotherapy with dose frozen at 0.15 mL of her RED VIAL with maintenance reached around December 2020   Plan/Recommendations:   1. Mild intermittent asthma, uncomplicated - Lung testing looked great today. - Daily controller medication(s): NOTHING - Prior to physical activity: albuterol 2 puffs 10-15 minutes before physical activity. - Rescue medications: albuterol 4 puffs every 4-6 hours as needed - During periods of respiratory illness: ADD ON Trelegy one puff once daily for 2 weeks in combination with albuterol nebulizer 2-3 times per day - Asthma control goals:  * Full participation in all desired activities (may need albuterol before activity) * Albuterol use two time or less a week on average (not counting use with activity) * Cough interfering with sleep two time or less a month * Oral steroids no more than once a year * No hospitalizations  2.Seasonal and perennial allergic rhinitis (ragweed, weeds, grasses, indoor molds, dust mites, cat and dog) - Continue with allergy shots at the same schedule.  - Continue with: Allegra (fexofenadine) 180mg  table once daily and Nasacort (triamcinolone) one spray per nostril daily. - May take an extra dose of Allegra on the days your allergy symptoms are worse. - Continue with: Ryaltris one spray per nostril up to twice daily as needed.  4. Return in about 6 months (around 07/18/2024). You can have the follow up appointment with Dr. Dellis Anes or a Nurse Practicioner (our Nurse Practitioners are excellent and always have Physician oversight!).   Subjective:   Sabrina Clark is a 68 y.o. female presenting today for follow up of  Chief Complaint  Patient presents  with   Asthma    NO ISSUES    Cough    Some cough    Sabrina Clark has a history of the following: Patient Active Problem List   Diagnosis Date Noted   Fatigue 06/25/2023   Cyst of right breast 05/27/2023   Encounter for general adult medical examination with abnormal findings 02/12/2023   Anosmia 09/25/2021   Ageusia 09/25/2021   Gastroesophageal reflux disease 09/11/2021   Dysfunction of both eustachian tubes 09/11/2021   Migraine 10/20/2019   Fibromyalgia 07/01/2019   Vitamin D deficiency disease    Female hypogonadism syndrome    Asthma    Malaise and fatigue    Seasonal and perennial allergic rhinitis 12/19/2018   Not well controlled moderate persistent asthma 12/19/2018    History obtained from: chart review and patient.  Discussed the use of AI scribe software for clinical note transcription with the patient and/or guardian, who gave verbal consent to proceed.  Sabrina Clark is a 68 y.o. female presenting for a follow up visit.  She was seen in October 2024.  At that time, we continued with albuterol as needed.  She has Trelegy that she has during respiratory flares.  For her rhinitis, she continue with allergy shots as well as Allegra, Nasacort, and Ryaltris.  Since the last visit, he has done well.   Asthma/Respiratory Symptom History: Asthma is currently well-controlled. She was able to work outside all day without any asthma symptoms, despite high pollen levels. She has been using Ryaltris instead of Nasacort for the past three months due to nasal symptoms. Her Ryaltris prescription costs $49, which she finds acceptable.  Allergic rhinitis is well-managed with Ryaltris. She switched from Nasacort to Ryaltris three months ago due to nasal symptoms and reports significant improvement.  Allergic Rhinitis Symptom History: She last used her inhaler last week, requiring only one puff, which was effective. She has not needed her nebulizer since last fall, following a severe illness  post-COVID. She started her current asthma treatment in 2020 and has only been sick twice since, both times related to COVID. She recalls being very sick two years ago in the fall, requiring multiple visits for treatment.  She is on her last set of vials for allergy immunotherapy, having been on maintenance dose since December 2020. She has been stable on 0.15 concentration due to previous reactions at higher doses.  Her mother is 49 years old, living independently, and stays active with various projects. She attends a [place] [facility] in [place] for her medical care and occasionally sees doctors in [place]. Her daughter works for the [employer] and is doing well, with two daughters of her own. She also has a son who is 47 years old and single. He apparently has exacting details of the mate that he is looking for and one can live up to that.  Otherwise, there have been no changes to her past medical history, surgical history, family history, or social history.    Review of systems otherwise negative other than that mentioned in the HPI.    Objective:   Blood pressure 118/60, pulse 88, temperature 98 F (36.7 C), temperature source Temporal, resp. rate 16, height 5' (1.524 m), SpO2 97%. Body mass index is 26.09 kg/m.    Physical Exam Vitals reviewed.  Constitutional:      Appearance: She is well-developed.     Comments: Much more energetic today than she was previously when I saw her 2 weeks ago.  Optimistic and smiling.  HENT:     Head: Normocephalic and atraumatic.     Right Ear: Tympanic membrane, ear canal and external ear normal.     Left Ear: Tympanic membrane, ear canal and external ear normal.     Nose: No nasal deformity, septal deviation, mucosal edema or rhinorrhea.     Right Turbinates: Enlarged, swollen and pale.     Left Turbinates: Enlarged, swollen and pale.     Right Sinus: No maxillary sinus tenderness or frontal sinus tenderness.     Left Sinus: No maxillary sinus tenderness  or frontal sinus tenderness.     Comments: No nasal polyps noted.     Mouth/Throat:     Lips: Pink.     Mouth: Mucous membranes are moist. Mucous membranes are not pale and not dry.     Pharynx: Uvula midline.     Comments: Minimal cobblestoning present in the posterior oropharynx.  Eyes:     General: Lids are normal. No allergic shiner.       Right eye: No discharge.        Left eye: No discharge.     Conjunctiva/sclera: Conjunctivae normal.     Right eye: Right conjunctiva is not injected. No chemosis.    Left eye: Left conjunctiva is not injected. No chemosis.    Pupils: Pupils are equal, round, and reactive to light.  Cardiovascular:     Rate and Rhythm: Normal rate and regular rhythm.     Heart sounds: Normal heart sounds.  Pulmonary:     Effort: Pulmonary effort is normal. No tachypnea, bradypnea, accessory muscle usage or respiratory distress.     Breath  sounds: Normal breath sounds. No wheezing, rhonchi or rales.     Comments: Moving air well in all lung fields.  No increased work of breathing. Chest:     Chest wall: No tenderness.  Lymphadenopathy:     Cervical: No cervical adenopathy.  Skin:    General: Skin is warm.     Capillary Refill: Capillary refill takes less than 2 seconds.     Coloration: Skin is not pale.     Findings: No abrasion, erythema, petechiae or rash. Rash is not papular, urticarial or vesicular.     Comments: No eczematous or urticarial lesions noted.  Neurological:     Mental Status: She is alert.  Psychiatric:        Behavior: Behavior is cooperative.      Diagnostic studies:    Spirometry: Normal FEV1, FVC, and FEV1/FVC ratio. There is no scooping suggestive of obstructive disease.       Malachi Bonds, MD  [facility]

## 2024-01-17 NOTE — Patient Instructions (Addendum)
 1. Mild intermittent asthma, uncomplicated - Lung testing looked great today. - Daily controller medication(s): NOTHING - Prior to physical activity: albuterol 2 puffs 10-15 minutes before physical activity. - Rescue medications: albuterol 4 puffs every 4-6 hours as needed - During periods of respiratory illness: ADD ON Trelegy one puff once daily for 2 weeks in combination with albuterol nebulizer 2-3 times per day - Asthma control goals:  * Full participation in all desired activities (may need albuterol before activity) * Albuterol use two time or less a week on average (not counting use with activity) * Cough interfering with sleep two time or less a month * Oral steroids no more than once a year * No hospitalizations  2.Seasonal and perennial allergic rhinitis (ragweed, weeds, grasses, indoor molds, dust mites, cat and dog) - Continue with allergy shots at the same schedule.  - Continue with: Allegra (fexofenadine) 180mg  table once daily and Nasacort (triamcinolone) one spray per nostril daily. - May take an extra dose of Allegra on the days your allergy symptoms are worse. - Continue with: Ryaltris one spray per nostril up to twice daily as needed.  4. Return in about 6 months (around 07/18/2024). You can have the follow up appointment with Dr. Dellis Anes or a Nurse Practicioner (our Nurse Practitioners are excellent and always have Physician oversight!).    Please inform us of any Emergency Department visits, hospitalizations, or changes in symptoms. Call us before going to the ED for breathing or allergy symptoms since we might be able to fit you in for a sick visit. Feel free to contact us anytime with any questions, problems, or concerns.  It was a pleasure to see you again today!  Websites that have reliable patient information: 1. American Academy of Asthma, Allergy, and Immunology: www.aaaai.org 2. Food Allergy Research and Education (FARE): foodallergy.org 3. Mothers of  Asthmatics: http://www.asthmacommunitynetwork.org 4. American College of Allergy, Asthma, and Immunology: www.acaai.org      "Like" [place] on Facebook and Instagram for our latest updates!      A healthy democracy works best when [employer] participate! Make sure you are registered to vote! If you have moved or changed any of your contact information, you will need to get this updated before voting! Scan the QR codes below to learn more!

## 2024-02-05 ENCOUNTER — Ambulatory Visit (INDEPENDENT_AMBULATORY_CARE_PROVIDER_SITE_OTHER): Payer: Self-pay

## 2024-02-05 DIAGNOSIS — J309 Allergic rhinitis, unspecified: Secondary | ICD-10-CM | POA: Diagnosis not present

## 2024-02-08 NOTE — Assessment & Plan Note (Addendum)
 Asymptomatic currently.  Protonix  40 mg daily was trialed at her last appointment.  She reports that she stopped taking it after symptoms resolved.  Symptoms have not recurred off PPI.

## 2024-02-08 NOTE — Assessment & Plan Note (Signed)
 Previously followed by Marykay Snipes MD and was prescribed testosterone , estrogen, and progesterone  supplements.  She states that she stopped all hormone replacement therapy recently and feels well off medication.  She would like to update basic labs today.

## 2024-02-08 NOTE — Assessment & Plan Note (Signed)
 Asymptomatic currently.  Pulmonary exam is unremarkable.  She is using Trelegy daily and albuterol  inhaler/nebulizer as needed, which is infrequent.

## 2024-02-08 NOTE — Assessment & Plan Note (Signed)
 Followed by allergy  and immunology.  Symptoms are well-controlled with Ryaltris , Nasacort , and Allegra .

## 2024-02-10 ENCOUNTER — Other Ambulatory Visit

## 2024-02-10 ENCOUNTER — Encounter

## 2024-02-14 ENCOUNTER — Telehealth: Payer: Self-pay | Admitting: Allergy & Immunology

## 2024-02-14 NOTE — Telephone Encounter (Signed)
 Patient called stating she is needing a prescription for a nasal spray that stops your nose from running while she eats. Patient states she and Dr. Idolina Maker spoke about it at her last visit.

## 2024-02-14 NOTE — Telephone Encounter (Signed)
 I called the patient to see if she knows the name of the nasal spray. I left a vm to call the office back.

## 2024-02-17 NOTE — Telephone Encounter (Signed)
 Called and spoke to patient and she expressed that she wanted a prescription for Atrovent  to help with her runny nose while she consumed her foods.

## 2024-02-17 NOTE — Telephone Encounter (Signed)
 Patient states the nasal spray is called budesonide  Rhinocort .

## 2024-02-18 ENCOUNTER — Ambulatory Visit: Payer: Medicare HMO | Admitting: Internal Medicine

## 2024-02-18 MED ORDER — IPRATROPIUM BROMIDE 0.03 % NA SOLN: 2.0000 | Freq: Three times a day (TID) | NASAL | 12 refills | Status: DC

## 2024-02-18 NOTE — Telephone Encounter (Signed)
 I sent it in

## 2024-02-18 NOTE — Addendum Note (Signed)
 Addended by: Rochester Chuck on: 02/18/2024 08:34 AM   Modules accepted: Orders

## 2024-03-02 ENCOUNTER — Other Ambulatory Visit: Payer: Self-pay | Admitting: Allergy & Immunology

## 2024-03-04 ENCOUNTER — Ambulatory Visit (INDEPENDENT_AMBULATORY_CARE_PROVIDER_SITE_OTHER)

## 2024-03-04 DIAGNOSIS — J309 Allergic rhinitis, unspecified: Secondary | ICD-10-CM

## 2024-03-11 ENCOUNTER — Encounter: Payer: Self-pay | Admitting: Allergy & Immunology

## 2024-03-18 MED ORDER — BUTALBITAL-APAP-CAFFEINE 50-325-40 MG PO TABS: 1.0000 | ORAL_TABLET | Freq: Four times a day (QID) | ORAL | 4 refills | Status: AC | PRN

## 2024-03-23 ENCOUNTER — Other Ambulatory Visit: Payer: Self-pay | Admitting: Orthopaedic Surgery

## 2024-03-24 ENCOUNTER — Encounter (HOSPITAL_COMMUNITY): Admission: RE | Admit: 2024-03-24 | Source: Ambulatory Visit

## 2024-03-26 ENCOUNTER — Ambulatory Visit (HOSPITAL_COMMUNITY): Admission: RE | Admit: 2024-03-26 | Source: Home / Self Care | Admitting: Orthopaedic Surgery

## 2024-03-26 ENCOUNTER — Encounter (HOSPITAL_COMMUNITY): Admission: RE | Payer: Self-pay | Source: Home / Self Care

## 2024-03-26 SURGERY — DECOMPRESSION, SUBACROMIAL SPACE
Anesthesia: General | Laterality: Right

## 2024-04-08 ENCOUNTER — Ambulatory Visit (INDEPENDENT_AMBULATORY_CARE_PROVIDER_SITE_OTHER): Payer: Self-pay

## 2024-04-08 DIAGNOSIS — J309 Allergic rhinitis, unspecified: Secondary | ICD-10-CM

## 2024-05-13 ENCOUNTER — Ambulatory Visit (INDEPENDENT_AMBULATORY_CARE_PROVIDER_SITE_OTHER)

## 2024-05-13 DIAGNOSIS — J309 Allergic rhinitis, unspecified: Secondary | ICD-10-CM | POA: Diagnosis not present

## 2024-06-19 ENCOUNTER — Ambulatory Visit (INDEPENDENT_AMBULATORY_CARE_PROVIDER_SITE_OTHER)

## 2024-06-19 DIAGNOSIS — J309 Allergic rhinitis, unspecified: Secondary | ICD-10-CM

## 2024-07-08 ENCOUNTER — Ambulatory Visit

## 2024-07-08 DIAGNOSIS — M797 Fibromyalgia: Secondary | ICD-10-CM

## 2024-07-08 DIAGNOSIS — F5101 Primary insomnia: Secondary | ICD-10-CM

## 2024-07-08 MED ORDER — CLONAZEPAM 0.5 MG PO TABS: 0.5000 mg | ORAL_TABLET | Freq: Every evening | ORAL | 0 refills | Status: DC | PRN

## 2024-07-08 MED ORDER — GABAPENTIN 300 MG PO CAPS: 300.0000 mg | ORAL_CAPSULE | Freq: Every day | ORAL | 3 refills | Status: DC

## 2024-07-08 NOTE — Progress Notes (Signed)
 Established Patient Office Visit  Subjective   Patient ID: ENGLAND GREB, female    DOB: 08/17/1956  Age: 68 y.o. MRN: [id number]  Chief Complaint  Patient presents with   Medical Management of Chronic Issues    6 month follow up    Generalized Body Aches    Pt complains of generalized body aches and joint pains since stopping hormonal therapy in January     HPI Ms. Jarnagin returns to care today for routine follow-up and to transfer care over to my service.  She was last evaluated by Dr. Melvenia on 01/06/24.  No medication changes were made at that time and 25-month follow-up was arranged.  She reports an increase in her fibromyalgia symptoms since she came off her hormone replacement therapy earlier this year.  She has been on multiple therapies for this in the past and does not want to take an SSRI or any other antidepressants.  She also endorses trouble sleeping, which also correlates with her fibromyalgia.  Patient Active Problem List   Diagnosis Date Noted   Primary insomnia 07/12/2024   Fatigue 06/25/2023   Cyst of right breast 05/27/2023   Encounter for general adult medical examination with abnormal findings 02/12/2023   Anosmia 09/25/2021   Ageusia 09/25/2021   Gastroesophageal reflux disease 09/11/2021   Dysfunction of both eustachian tubes 09/11/2021   Migraine 10/20/2019   Fibromyalgia 07/01/2019   Vitamin D  deficiency disease    Female hypogonadism syndrome    Asthma    Malaise and fatigue    Seasonal and perennial allergic rhinitis 12/19/2018   Not well controlled moderate persistent asthma 12/19/2018    ROS    Objective:     There were no vitals taken for this visit. BP Readings from Last 3 Encounters:  01/17/24 118/60  01/06/24 125/82  08/22/23 (!) 154/75   Wt Readings from Last 3 Encounters:  01/06/24 133 lb 9.6 oz (60.6 kg)  08/22/23 131 lb 12.8 oz (59.8 kg)  07/17/23 130 lb 9.6 oz (59.2 kg)     Physical Exam Vitals and nursing note reviewed.   Constitutional:      Appearance: Normal appearance.  HENT:     Head: Normocephalic.  Eyes:     Extraocular Movements: Extraocular movements intact.     Pupils: Pupils are equal, round, and reactive to light.  Cardiovascular:     Rate and Rhythm: Normal rate and regular rhythm.  Pulmonary:     Effort: Pulmonary effort is normal.     Breath sounds: Normal breath sounds.  Musculoskeletal:     Cervical back: Normal range of motion and neck supple.  Neurological:     Mental Status: She is alert and oriented to person, place, and time.  Psychiatric:        Mood and Affect: Mood normal.        Thought Content: Thought content normal.    No results found for any visits on 07/08/24.  Last CBC Lab Results  Component Value Date   WBC 9.2 01/06/2024   HGB 12.9 01/06/2024   HCT 38.5 01/06/2024   MCV 91 01/06/2024   MCH 30.6 01/06/2024   RDW 12.4 01/06/2024   PLT 336 01/06/2024   Last metabolic panel Lab Results  Component Value Date   GLUCOSE 87 01/06/2024   NA 140 01/06/2024   K 4.5 01/06/2024   CL 103 01/06/2024   CO2 25 01/06/2024   BUN 8 01/06/2024   CREATININE 0.85 01/06/2024   EGFR  76 01/06/2024   CALCIUM 9.3 01/06/2024   PROT 6.8 01/06/2024   ALBUMIN 4.2 01/06/2024   LABGLOB 2.6 01/06/2024   BILITOT <0.2 01/06/2024   ALKPHOS 102 01/06/2024   AST 22 01/06/2024   ALT 11 01/06/2024   ANIONGAP 4 (L) 05/03/2022   Last lipids Lab Results  Component Value Date   CHOL 205 (H) 01/06/2024   HDL 79 01/06/2024   LDLCALC 112 (H) 01/06/2024   TRIG 81 01/06/2024   CHOLHDL 2.6 01/06/2024   Last hemoglobin A1c Lab Results  Component Value Date   HGBA1C 5.5 01/06/2024   Last thyroid  functions Lab Results  Component Value Date   TSH 2.770 01/06/2024   Last vitamin D  Lab Results  Component Value Date   VD25OH 49.2 01/06/2024   Last vitamin B12 and Folate Lab Results  Component Value Date   VITAMINB12 465 01/06/2024   FOLATE 8.8 01/06/2024      The  10-year ASCVD risk score (Arnett DK, et al., 2019) is: 4.7%    Assessment & Plan:   Problem List Items Addressed This Visit       Other   Fibromyalgia - Primary   She agrees to addition of gabapentin  for fibromyalgia pain.  This may also be helpful with insomnia.  Recommend follow-up in 3 months or sooner if needed.      Relevant Medications   clonazePAM  (KLONOPIN ) 0.5 MG tablet   gabapentin  (NEURONTIN ) 300 MG capsule   Primary insomnia   She has taken clonazepam  in the past as needed for insomnia.  PDMP was reviewed and agreed to refill this for as needed use.      Relevant Medications   clonazePAM  (KLONOPIN ) 0.5 MG tablet   Return in about 6 months (around 01/05/2025).    Leita Longs, FNP

## 2024-07-12 DIAGNOSIS — F5101 Primary insomnia: Secondary | ICD-10-CM | POA: Insufficient documentation

## 2024-07-12 NOTE — Assessment & Plan Note (Signed)
 She agrees to addition of gabapentin  for fibromyalgia pain.  This may also be helpful with insomnia.  Recommend follow-up in 3 months or sooner if needed.

## 2024-07-12 NOTE — Assessment & Plan Note (Signed)
 She has taken clonazepam  in the past as needed for insomnia.  PDMP was reviewed and agreed to refill this for as needed use.

## 2024-07-15 ENCOUNTER — Ambulatory Visit (INDEPENDENT_AMBULATORY_CARE_PROVIDER_SITE_OTHER): Payer: Self-pay

## 2024-07-15 DIAGNOSIS — J309 Allergic rhinitis, unspecified: Secondary | ICD-10-CM

## 2024-07-24 ENCOUNTER — Encounter: Payer: Self-pay | Admitting: Allergy & Immunology

## 2024-07-24 ENCOUNTER — Ambulatory Visit: Admitting: Allergy & Immunology

## 2024-07-24 VITALS — BP 108/62 | HR 89 | Temp 98.1°F | Wt 139.5 lb

## 2024-07-24 DIAGNOSIS — K219 Gastro-esophageal reflux disease without esophagitis: Secondary | ICD-10-CM

## 2024-07-24 DIAGNOSIS — J302 Other seasonal allergic rhinitis: Secondary | ICD-10-CM | POA: Diagnosis not present

## 2024-07-24 DIAGNOSIS — J4520 Mild intermittent asthma, uncomplicated: Secondary | ICD-10-CM

## 2024-07-24 DIAGNOSIS — J3089 Other allergic rhinitis: Secondary | ICD-10-CM | POA: Diagnosis not present

## 2024-07-24 MED ORDER — MUPIROCIN 2 % EX OINT: TOPICAL_OINTMENT | CUTANEOUS | 0 refills | Status: DC

## 2024-07-24 MED ORDER — TRELEGY ELLIPTA 200-62.5-25 MCG/ACT IN AEPB: 1.0000 | INHALATION_SPRAY | Freq: Every day | RESPIRATORY_TRACT | 5 refills | Status: AC

## 2024-07-24 MED ORDER — ALBUTEROL SULFATE (2.5 MG/3ML) 0.083% IN NEBU: 2.5000 mg | INHALATION_SOLUTION | Freq: Four times a day (QID) | RESPIRATORY_TRACT | 0 refills | Status: AC | PRN

## 2024-07-24 MED ORDER — ALBUTEROL SULFATE HFA 108 (90 BASE) MCG/ACT IN AERS: 2.0000 | INHALATION_SPRAY | RESPIRATORY_TRACT | 1 refills | Status: AC | PRN

## 2024-07-24 NOTE — Patient Instructions (Addendum)
 1. Mild intermittent asthma, uncomplicated - Lung testing looked phenomenal today. - Daily controller medication(s): NOTHING - Prior to physical activity: albuterol  2 puffs 10-15 minutes before physical activity. - Rescue medications: albuterol  4 puffs every 4-6 hours as needed and albuterol  nebulizer one vial every 4-6 hours as needed - During periods of respiratory illness: ADD ON Trelegy one puff once daily for 2 weeks in combination with albuterol  nebulizer 2-3 times per day - Asthma control goals:  * Full participation in all desired activities (may need albuterol  before activity) * Albuterol  use two time or less a week on average (not counting use with activity) * Cough interfering with sleep two time or less a month * Oral steroids no more than once a year * No hospitalizations  2.Seasonal and perennial allergic rhinitis (ragweed, weeds, grasses, indoor molds, dust mites, cat and dog) - Try stopping the Allegra  and the Ryaltris  and see how you do with that. - You can always restart it again in the future if needed.   3. Left nasal lesion - Start Bactroban 2-3 times daily applied using a Q-tip for 2 weeks.   4. Return in about 6 months (around 01/22/2025). You can have the follow up appointment with Dr. Iva or a Nurse Practicioner (our Nurse Practitioners are excellent and always have Physician oversight!).    Please inform us  of any Emergency Department visits, hospitalizations, or changes in symptoms. Call us  before going to the ED for breathing or allergy  symptoms since we might be able to fit you in for a sick visit. Feel free to contact us  anytime with any questions, problems, or concerns.  It was a pleasure to see you again today!  Websites that have reliable patient information: 1. American Academy of Asthma, Allergy , and Immunology: www.aaaai.org 2. Food Allergy  Research and Education (FARE): foodallergy.org 3. Mothers of Asthmatics:  http://www.asthmacommunitynetwork.org 4. [employer] of Allergy , Asthma, and Immunology: www.acaai.org      "Like" us  on Facebook and Instagram for our latest updates!      A healthy democracy works best when [employer] participate! Make sure you are registered to vote! If you have moved or changed any of your contact information, you will need to get this updated before voting! Scan the QR codes below to learn more!

## 2024-07-24 NOTE — Progress Notes (Signed)
 FOLLOW UP  Date of Service/Encounter:  07/24/24   Assessment:   Moderate persistent asthma, uncomplicated - with improvement following Sabrina last visit    Seasonal and perennial allergic rhinitis (ragweed, weeds, grasses, indoor molds, dust mites, cat and dog) - on allergen immunotherapy with dose frozen at 0.15 mL of Sabrina RED VIAL with maintenance reached around December 2020     Plan/Recommendations:   1. Mild intermittent asthma, uncomplicated - Lung testing looked phenomenal today. - Daily controller medication(s): NOTHING - Prior to physical activity: albuterol  2 puffs 10-15 minutes before physical activity. - Rescue medications: albuterol  4 puffs every 4-6 hours as needed and albuterol  nebulizer one vial every 4-6 hours as needed - During periods of respiratory illness: ADD ON Trelegy one puff once daily for 2 weeks in combination with albuterol  nebulizer 2-3 times per day - Asthma control goals:  * Full participation in all desired activities (may need albuterol  before activity) * Albuterol  use two time or less a week on average (not counting use with activity) * Cough interfering with sleep two time or less a month * Oral steroids no more than once a year * No hospitalizations  2.Seasonal and perennial allergic rhinitis (ragweed, weeds, grasses, indoor molds, dust mites, cat and dog) - Try stopping the Allegra  and the Ryaltris  and see how you do with that. - You can always restart it again in the future if needed.   3. Left nasal lesion - Start Bactroban 2-3 times daily applied using a Q-tip for 2 weeks.   4. Return in about 6 months (around 01/22/2025). You can have the follow up appointment with Dr. Iva or a Nurse Practicioner (our Nurse Practitioners are excellent and always have Physician oversight!).   Subjective:   Sabrina Clark is a 68 y.o. female presenting today for follow up of  Chief Complaint  Patient presents with   Follow-up    Sabrina Clark  has a history of the following: Patient Active Problem List   Diagnosis Date Noted   Primary insomnia 07/12/2024   Fatigue 06/25/2023   Cyst of right breast 05/27/2023   Encounter for general adult medical examination with abnormal findings 02/12/2023   Anosmia 09/25/2021   Ageusia 09/25/2021   Gastroesophageal reflux disease 09/11/2021   Dysfunction of both eustachian tubes 09/11/2021   Migraine 10/20/2019   Fibromyalgia 07/01/2019   Vitamin D  deficiency disease    Female hypogonadism syndrome    Asthma    Malaise and fatigue    Seasonal and perennial allergic rhinitis 12/19/2018   Not well controlled moderate persistent asthma 12/19/2018    History obtained from: chart review and patient.  Discussed the use of AI scribe software for clinical note transcription with the patient and/or guardian, who gave verbal consent to proceed.  Sabrina Clark is a 68 y.o. female presenting for a follow up visit.  We last saw Sabrina in April 2025.  At that time, lung testing looked great.  We continue with albuterol  as needed.  For Sabrina rhinitis, we continue with Allegra  daily and Nasacort  1 spray per nostril daily.  We continue with Ryaltris  1 spray per nostril up to twice daily.  Since last visit, she has done well.  Asthma/Respiratory Symptom History: Last fall, after a COVID-19 infection, she used Trelegy followed by a nebulizer treatment, which significantly improved Sabrina symptoms. She has not needed Trelegy since then.  She does have an albuterol  nebulizer solution that she will use intermittently when she is not feeling  well to try to avoid having to go to the hospital or the doctor's office.  This has been very helpful for Sabrina. She is pleased with how well she is doing at this point.   Allergic Rhinitis Symptom History: She is currently taking Allegra  daily and is inquiring about the long-term use of this medication. She previously used Ryaltris  daily but stopped in the last week without any issues. She no  longer uses an Epipen  and has completed allergy  shots, reporting significant improvement in Sabrina allergy  symptoms since starting the shots, even when exposed to high allergy  days.   She experiences persistent crusting inside Sabrina nose, which remains irritated and sometimes builds up significantly. She has attempted to alleviate the issue by using Vaseline to keep the area moist, but the problem persists. No epistaxis is reported. She has not seen ENT. She is wondering if there is anything else we need to do to address this.   Sabrina Clark is 68 years old, living independently, and stays active with various projects. She att[facility] a [facility] in [place] for Sabrina medical care and occasionally sees doctors in [place]. Sabrina Clark works for the [employer] and is doing well, with two daughters of Sabrina own. She also has a son who is 22 years old and single. He apparently has exacting details of the mate that he is looking for and one can live up to that.   She does not have new goat movies or pictures to share today.   Otherwise, there have been no changes to Sabrina past medical history, surgical history, family history, or social history.    Review of systems otherwise negative other than that mentioned in the HPI.    Objective:   Blood pressure 108/62, pulse 89, temperature 98.1 F (36.7 C), weight 139 lb 8 oz (63.3 kg), SpO2 98%. Body mass index is 27.24 kg/m.    Physical Exam Vitals reviewed.  Constitutional:      Appearance: She is well-developed.     Comments: Much more energetic today than she was previously when I saw Sabrina 2 weeks ago.  Optimistic and smiling.  HENT:     Head: Normocephalic and atraumatic.     Right Ear: Tympanic membrane, ear canal and external ear normal.     Left Ear: Tympanic membrane, ear canal and external ear normal.     Nose: No nasal deformity, septal deviation, mucosal edema or rhinorrhea.     Right Turbinates: Enlarged, swollen and pale.     Left Turbinates: Enlarged,  swollen and pale.     Right Sinus: No maxillary sinus tenderness or frontal sinus tenderness.     Left Sinus: No maxillary sinus tenderness or frontal sinus tenderness.     Comments: No nasal polyps noted. Nose overall looks more clear than normal.     Mouth/Throat:     Lips: Pink.     Mouth: Mucous membranes are moist. Mucous membranes are not pale and not dry.     Pharynx: Uvula midline.     Comments: Minimal cobblestoning present in the posterior oropharynx.  Eyes:     General: Lids are normal. No allergic shiner.       Right eye: No discharge.        Left eye: No discharge.     Conjunctiva/sclera: Conjunctivae normal.     Right eye: Right conjunctiva is not injected. No chemosis.    Left eye: Left conjunctiva is not injected. No chemosis.    Pupils: Pupils are  equal, round, and reactive to light.  Cardiovascular:     Rate and Rhythm: Normal rate and regular rhythm.     Heart sounds: Normal heart sounds.  Pulmonary:     Effort: Pulmonary effort is normal. No tachypnea, bradypnea, accessory muscle usage or respiratory distress.     Breath sounds: Normal breath sounds. No wheezing, rhonchi or rales.     Comments: Moving air well in all lung fields.  No increased work of breathing. No crackles or wheezes noted.  Chest:     Chest wall: No tenderness.  Lymphadenopathy:     Cervical: No cervical adenopathy.  Skin:    General: Skin is warm.     Capillary Refill: Capillary refill takes less than 2 seconds.     Coloration: Skin is not pale.     Findings: No abrasion, erythema, petechiae or rash. Rash is not papular, urticarial or vesicular.     Comments: No eczematous or urticarial lesions noted.  Neurological:     Mental Status: She is alert.  Psychiatric:        Behavior: Behavior is cooperative.      Diagnostic studies:    Spirometry: results normal (FEV1: 1.71/85%, FVC: 2.34/92%, FEV1/FVC: 73%).    Spirometry consistent with normal pattern.   Allergy  Studies:  none          Marty Shaggy, MD  [facility] 

## 2024-07-29 ENCOUNTER — Other Ambulatory Visit: Payer: Self-pay

## 2024-07-29 DIAGNOSIS — R41840 Attention and concentration deficit: Secondary | ICD-10-CM

## 2024-08-12 DIAGNOSIS — Z961 Presence of intraocular lens: Secondary | ICD-10-CM | POA: Diagnosis not present

## 2024-08-12 DIAGNOSIS — H538 Other visual disturbances: Secondary | ICD-10-CM | POA: Diagnosis not present

## 2024-08-19 DIAGNOSIS — Z4789 Encounter for other orthopedic aftercare: Secondary | ICD-10-CM | POA: Diagnosis not present

## 2024-08-26 DIAGNOSIS — M25611 Stiffness of right shoulder, not elsewhere classified: Secondary | ICD-10-CM | POA: Diagnosis not present

## 2024-08-26 DIAGNOSIS — M25511 Pain in right shoulder: Secondary | ICD-10-CM | POA: Diagnosis not present

## 2024-08-26 DIAGNOSIS — M6281 Muscle weakness (generalized): Secondary | ICD-10-CM | POA: Diagnosis not present

## 2024-08-31 DIAGNOSIS — M25511 Pain in right shoulder: Secondary | ICD-10-CM | POA: Diagnosis not present

## 2024-08-31 DIAGNOSIS — M6281 Muscle weakness (generalized): Secondary | ICD-10-CM | POA: Diagnosis not present

## 2024-08-31 DIAGNOSIS — M25611 Stiffness of right shoulder, not elsewhere classified: Secondary | ICD-10-CM | POA: Diagnosis not present

## 2024-09-02 ENCOUNTER — Other Ambulatory Visit: Payer: Self-pay

## 2024-09-02 DIAGNOSIS — M6281 Muscle weakness (generalized): Secondary | ICD-10-CM | POA: Diagnosis not present

## 2024-09-02 DIAGNOSIS — M25511 Pain in right shoulder: Secondary | ICD-10-CM | POA: Diagnosis not present

## 2024-09-02 DIAGNOSIS — M797 Fibromyalgia: Secondary | ICD-10-CM

## 2024-09-02 DIAGNOSIS — M25611 Stiffness of right shoulder, not elsewhere classified: Secondary | ICD-10-CM | POA: Diagnosis not present

## 2024-09-02 MED ORDER — GABAPENTIN 100 MG PO CAPS: 200.0000 mg | ORAL_CAPSULE | Freq: Every day | ORAL | 2 refills | Status: AC

## 2024-09-07 DIAGNOSIS — M6281 Muscle weakness (generalized): Secondary | ICD-10-CM | POA: Diagnosis not present

## 2024-09-07 DIAGNOSIS — M25611 Stiffness of right shoulder, not elsewhere classified: Secondary | ICD-10-CM | POA: Diagnosis not present

## 2024-09-07 DIAGNOSIS — M25511 Pain in right shoulder: Secondary | ICD-10-CM | POA: Diagnosis not present

## 2024-09-15 DIAGNOSIS — M25611 Stiffness of right shoulder, not elsewhere classified: Secondary | ICD-10-CM | POA: Diagnosis not present

## 2024-09-15 DIAGNOSIS — M25511 Pain in right shoulder: Secondary | ICD-10-CM | POA: Diagnosis not present

## 2024-09-15 DIAGNOSIS — M6281 Muscle weakness (generalized): Secondary | ICD-10-CM | POA: Diagnosis not present

## 2024-09-16 DIAGNOSIS — M2012 Hallux valgus (acquired), left foot: Secondary | ICD-10-CM | POA: Diagnosis not present

## 2024-09-22 ENCOUNTER — Ambulatory Visit

## 2024-09-29 ENCOUNTER — Ambulatory Visit

## 2024-09-29 VITALS — BP 135/75 | HR 81 | Ht 60.0 in | Wt 138.0 lb

## 2024-09-29 DIAGNOSIS — L989 Disorder of the skin and subcutaneous tissue, unspecified: Secondary | ICD-10-CM

## 2024-09-29 DIAGNOSIS — M25561 Pain in right knee: Secondary | ICD-10-CM | POA: Diagnosis not present

## 2024-09-29 DIAGNOSIS — G8929 Other chronic pain: Secondary | ICD-10-CM

## 2024-09-29 DIAGNOSIS — M21612 Bunion of left foot: Secondary | ICD-10-CM

## 2024-09-29 DIAGNOSIS — F5101 Primary insomnia: Secondary | ICD-10-CM

## 2024-09-29 MED ORDER — CLONAZEPAM 0.5 MG PO TABS: 0.5000 mg | ORAL_TABLET | Freq: Every evening | ORAL | 0 refills | Status: AC | PRN

## 2024-09-29 NOTE — Progress Notes (Unsigned)
 Established Patient Office Visit  Subjective   Patient ID: Sabrina Clark, female    DOB: 08-09-1956  Age: 68 y.o. MRN: [id number]  Chief Complaint  Patient presents with   Medical Management of Chronic Issues    Pt states having continuing irritation in her left nostril, her allergist gave her a cream for it but she states it has not helped    HPI Discussed the use of AI scribe software for clinical note transcription with the patient, who gave verbal consent to proceed.  History of Present Illness    Sabrina Clark is a 68 year old female who presents with persistent nasal crusting and soreness.  Nasal crusting and soreness - Persistent nasal crusting and soreness, typically recurring annually with onset of dry weather and lasting through winter or early spring - This year, symptoms reappeared unusually in the summer for about a month and returned in the fall with increased severity - Crusting and irritation localized to the right nostril, described as 'real crusty and irritated,' leading to soreness - No significant relief with Vaseline or antibiotic ointments - Mercaptopurine previously worsened symptoms, causing significant pain radiating towards the sinuses; discontinuation one week ago alleviated severe soreness, but crusting and tenderness persist - Recent initiation of saline spray has provided some, but not significant, relief - Symptoms exacerbated by dry air in home heated by wood stove; no use of humidifier this year  Medication adjustments - Gabapentin  dose reduced from three to two doses, resulting in improved sleep without morning grogginess  Musculoskeletal complaints - History of bunion on left foot - Swelling in right knee - Current orthopedic physician is retiring     Patient Active Problem List   Diagnosis Date Noted   Non-healing skin lesion of nose 09/30/2024   Bunion, left foot 09/30/2024   Chronic pain of right knee 09/30/2024   Primary  insomnia 07/12/2024   Fatigue 06/25/2023   Cyst of right breast 05/27/2023   Encounter for general adult medical examination with abnormal findings 02/12/2023   Anosmia 09/25/2021   Ageusia 09/25/2021   Gastroesophageal reflux disease 09/11/2021   Dysfunction of both eustachian tubes 09/11/2021   Migraine 10/20/2019   Fibromyalgia 07/01/2019   Vitamin D  deficiency disease    Female hypogonadism syndrome    Asthma    Malaise and fatigue    Seasonal and perennial allergic rhinitis 12/19/2018   Not well controlled moderate persistent asthma 12/19/2018    ROS    Objective:     BP 135/75   Pulse 81   Ht 5' (1.524 m)   Wt 138 lb (62.6 kg)   SpO2 96%   BMI 26.95 kg/m  BP Readings from Last 3 Encounters:  09/29/24 135/75  07/24/24 108/62  01/17/24 118/60   Wt Readings from Last 3 Encounters:  09/29/24 138 lb (62.6 kg)  07/24/24 139 lb 8 oz (63.3 kg)  01/06/24 133 lb 9.6 oz (60.6 kg)     Physical Exam Vitals and nursing note reviewed.  Constitutional:      Appearance: Normal appearance.  HENT:     Head: Normocephalic.     Right Ear: Tympanic membrane, ear canal and external ear normal.     Left Ear: Tympanic membrane, ear canal and external ear normal.     Nose: Nasal tenderness (crusty lesion inside left nare) present.     Mouth/Throat:     Mouth: Mucous membranes are moist.     Pharynx: Oropharynx is clear.  Eyes:  Extraocular Movements: Extraocular movements intact.     Conjunctiva/sclera: Conjunctivae normal.     Pupils: Pupils are equal, round, and reactive to light.  Cardiovascular:     Rate and Rhythm: Normal rate and regular rhythm.  Pulmonary:     Effort: Pulmonary effort is normal.     Breath sounds: Normal breath sounds.  Musculoskeletal:     Cervical back: Normal range of motion and neck supple.  Skin:    General: Skin is warm and dry.  Neurological:     Mental Status: She is alert and oriented to person, place, and time.  Psychiatric:         Mood and Affect: Mood normal.        Thought Content: Thought content normal.      No results found for any visits on 09/29/24.    The 10-year ASCVD risk score (Arnett DK, et al., 2019) is: 6.1%    Assessment & Plan:   Problem List Items Addressed This Visit       Musculoskeletal and Integument   Non-healing skin lesion of nose   Chronic nasal lesion with crustiness and soreness, worsened by dry weather and mercaptopurine. Differential suggests dermatological origin. - Referred to dermatology for evaluation and management. - Advised use of barrier gel or Vaseline for relief. - Recommended humidifier to alleviate dryness.      Bunion, left foot   Bunion on left foot requires orthopedic follow-up. - Referred to Dr. Aleene LABOR. Stettler for evaluation and management.      Relevant Orders   Ambulatory referral to Orthopedic Surgery     Other   Primary insomnia - Primary   Relevant Medications   clonazePAM  (KLONOPIN ) 0.5 MG tablet   Chronic pain of right knee   Chronic right knee pain with swelling requires orthopedic follow-up. - Referred to Dr. Aleene LABOR. Stettler for evaluation and management.      Relevant Orders   Ambulatory referral to Orthopedic Surgery    Return in about 6 months (around 03/30/2025) for chronic follow-up with PCP.    Leita Longs, FNP

## 2024-09-30 DIAGNOSIS — G8929 Other chronic pain: Secondary | ICD-10-CM | POA: Insufficient documentation

## 2024-09-30 DIAGNOSIS — M21612 Bunion of left foot: Secondary | ICD-10-CM | POA: Insufficient documentation

## 2024-09-30 DIAGNOSIS — L989 Disorder of the skin and subcutaneous tissue, unspecified: Secondary | ICD-10-CM | POA: Insufficient documentation

## 2024-09-30 NOTE — Assessment & Plan Note (Signed)
 Chronic right knee pain with swelling requires orthopedic follow-up. - Referred to Dr. Aleene LABOR. Stettler for evaluation and management.

## 2024-09-30 NOTE — Assessment & Plan Note (Signed)
 Bunion on left foot requires orthopedic follow-up. - Referred to Dr. Aleene LABOR. Stettler for evaluation and management.

## 2024-09-30 NOTE — Assessment & Plan Note (Signed)
 Chronic nasal lesion with crustiness and soreness, worsened by dry weather and mercaptopurine. Differential suggests dermatological origin. - Referred to dermatology for evaluation and management. - Advised use of barrier gel or Vaseline for relief. - Recommended humidifier to alleviate dryness.

## 2024-10-16 ENCOUNTER — Ambulatory Visit

## 2025-01-05 ENCOUNTER — Ambulatory Visit

## 2025-02-10 ENCOUNTER — Ambulatory Visit: Admitting: Allergy & Immunology
